# Patient Record
Sex: Male | Born: 1975
Health system: Southern US, Community
[De-identification: ages and names within clinical notes are randomized; demographics above are authoritative.]

## PROBLEM LIST (undated history)

## (undated) DIAGNOSIS — J45909 Unspecified asthma, uncomplicated: Secondary | ICD-10-CM

## (undated) DIAGNOSIS — J301 Allergic rhinitis due to pollen: Secondary | ICD-10-CM

## (undated) DIAGNOSIS — T7840XA Allergy, unspecified, initial encounter: Secondary | ICD-10-CM

## (undated) HISTORY — DX: Allergic rhinitis due to pollen: J30.1

## (undated) HISTORY — PX: CYST EXCISION: SHX5701

## (undated) HISTORY — DX: Allergy, unspecified, initial encounter: T78.40XA

## (undated) HISTORY — PX: NO PAST SURGERIES: SHX2092

---

## 2007-06-12 ENCOUNTER — Emergency Department (HOSPITAL_COMMUNITY): Admission: EM | Admit: 2007-06-12 | Discharge: 2007-06-12 | Payer: Self-pay | Admitting: Emergency Medicine

## 2007-10-12 ENCOUNTER — Emergency Department (HOSPITAL_COMMUNITY): Admission: EM | Admit: 2007-10-12 | Discharge: 2007-10-12 | Payer: Self-pay | Admitting: Emergency Medicine

## 2008-10-29 ENCOUNTER — Emergency Department (HOSPITAL_COMMUNITY): Admission: EM | Admit: 2008-10-29 | Discharge: 2008-10-29 | Payer: Self-pay | Admitting: Emergency Medicine

## 2009-12-22 ENCOUNTER — Emergency Department (HOSPITAL_COMMUNITY): Admission: EM | Admit: 2009-12-22 | Discharge: 2009-12-22 | Payer: Self-pay | Admitting: Emergency Medicine

## 2010-09-03 LAB — BASIC METABOLIC PANEL
BUN: 12 mg/dL (ref 6–23)
CO2: 28 mEq/L (ref 19–32)
Chloride: 105 mEq/L (ref 96–112)
Creatinine, Ser: 1.21 mg/dL (ref 0.4–1.5)
Glucose, Bld: 86 mg/dL (ref 70–99)
Potassium: 4.1 mEq/L (ref 3.5–5.1)

## 2011-02-15 LAB — URINALYSIS, ROUTINE W REFLEX MICROSCOPIC
Bilirubin Urine: NEGATIVE
Glucose, UA: NEGATIVE
Hgb urine dipstick: NEGATIVE
Specific Gravity, Urine: 1.029
Urobilinogen, UA: 0.2
pH: 6

## 2011-02-15 LAB — GC/CHLAMYDIA PROBE AMP, GENITAL: GC Probe Amp, Genital: NEGATIVE

## 2012-09-04 ENCOUNTER — Ambulatory Visit: Payer: Self-pay | Admitting: Family Medicine

## 2013-05-17 ENCOUNTER — Emergency Department (HOSPITAL_COMMUNITY)
Admission: EM | Admit: 2013-05-17 | Discharge: 2013-05-17 | Disposition: A | Discharge status: Home / Self Care | Payer: BC Managed Care – PPO | Attending: Emergency Medicine | Admitting: Emergency Medicine

## 2013-05-17 ENCOUNTER — Emergency Department (HOSPITAL_COMMUNITY): Payer: BC Managed Care – PPO

## 2013-05-17 ENCOUNTER — Encounter (HOSPITAL_COMMUNITY): Payer: Self-pay | Admitting: Emergency Medicine

## 2013-05-17 DIAGNOSIS — Y9389 Activity, other specified: Secondary | ICD-10-CM | POA: Insufficient documentation

## 2013-05-17 DIAGNOSIS — S46909A Unspecified injury of unspecified muscle, fascia and tendon at shoulder and upper arm level, unspecified arm, initial encounter: Secondary | ICD-10-CM | POA: Insufficient documentation

## 2013-05-17 DIAGNOSIS — IMO0002 Reserved for concepts with insufficient information to code with codable children: Secondary | ICD-10-CM | POA: Insufficient documentation

## 2013-05-17 DIAGNOSIS — S59909A Unspecified injury of unspecified elbow, initial encounter: Secondary | ICD-10-CM | POA: Insufficient documentation

## 2013-05-17 DIAGNOSIS — M25512 Pain in left shoulder: Secondary | ICD-10-CM

## 2013-05-17 DIAGNOSIS — J45909 Unspecified asthma, uncomplicated: Secondary | ICD-10-CM | POA: Insufficient documentation

## 2013-05-17 DIAGNOSIS — F17200 Nicotine dependence, unspecified, uncomplicated: Secondary | ICD-10-CM | POA: Insufficient documentation

## 2013-05-17 DIAGNOSIS — S4980XA Other specified injuries of shoulder and upper arm, unspecified arm, initial encounter: Secondary | ICD-10-CM | POA: Insufficient documentation

## 2013-05-17 DIAGNOSIS — M542 Cervicalgia: Secondary | ICD-10-CM

## 2013-05-17 DIAGNOSIS — M25522 Pain in left elbow: Secondary | ICD-10-CM

## 2013-05-17 DIAGNOSIS — S0993XA Unspecified injury of face, initial encounter: Secondary | ICD-10-CM | POA: Insufficient documentation

## 2013-05-17 DIAGNOSIS — Y92410 Unspecified street and highway as the place of occurrence of the external cause: Secondary | ICD-10-CM | POA: Insufficient documentation

## 2013-05-17 DIAGNOSIS — S6990XA Unspecified injury of unspecified wrist, hand and finger(s), initial encounter: Secondary | ICD-10-CM | POA: Insufficient documentation

## 2013-05-17 HISTORY — DX: Unspecified asthma, uncomplicated: J45.909

## 2013-05-17 MED ORDER — KETOROLAC TROMETHAMINE 15 MG/ML IJ SOLN
60.0000 mg | Freq: Once | INTRAMUSCULAR | Status: DC
  Filled 2013-05-17: qty 4

## 2013-05-17 MED ORDER — NAPROXEN 500 MG PO TABS: 500.0000 mg | ORAL_TABLET | Freq: Two times a day (BID) | ORAL | Status: DC

## 2013-05-17 MED ORDER — KETOROLAC TROMETHAMINE 60 MG/2ML IM SOLN
60.0000 mg | Freq: Once | INTRAMUSCULAR | Status: AC
  Administered 2013-05-17: 60 mg via INTRAMUSCULAR
  Filled 2013-05-17: qty 2

## 2013-05-17 MED ORDER — METHOCARBAMOL 500 MG PO TABS: 500.0000 mg | ORAL_TABLET | Freq: Two times a day (BID) | ORAL | Status: DC
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
## 2013-05-17 NOTE — Progress Notes (Signed)
[facility] provided pt with a list of primary care resources, [facility] card application, and [facility] information.

## 2013-05-21 NOTE — ED Provider Notes (Addendum)
Medical screening examination/treatment/procedure(s) were performed by non-physician practitioner and as supervising physician I was immediately available for consultation/collaboration.    Laray Anger, DO 05/21/13 1513  Laray Anger, DO 05/21/13 [phone]

## 2013-06-16 ENCOUNTER — Telehealth: Payer: Self-pay

## 2013-06-16 NOTE — Telephone Encounter (Signed)
Left message for call back Non identifiable  No pertinent information

## 2013-06-18 ENCOUNTER — Ambulatory Visit: Payer: BC Managed Care – PPO | Admitting: Internal Medicine

## 2013-06-21 NOTE — Telephone Encounter (Signed)
Patient rescheduled

## 2013-06-30 ENCOUNTER — Telehealth: Payer: Self-pay

## 2013-06-30 NOTE — Telephone Encounter (Signed)
LM with spouse for return call

## 2013-07-02 ENCOUNTER — Encounter: Payer: Self-pay | Admitting: Internal Medicine

## 2013-07-02 ENCOUNTER — Ambulatory Visit (INDEPENDENT_AMBULATORY_CARE_PROVIDER_SITE_OTHER): Payer: BC Managed Care – PPO | Admitting: Internal Medicine

## 2013-07-02 VITALS — BP 104/69 | HR 90 | Temp 98.2°F | Ht 69.5 in | Wt 225.0 lb

## 2013-07-02 DIAGNOSIS — J301 Allergic rhinitis due to pollen: Secondary | ICD-10-CM

## 2013-07-02 DIAGNOSIS — E010 Iodine-deficiency related diffuse (endemic) goiter: Secondary | ICD-10-CM

## 2013-07-02 DIAGNOSIS — Z0000 Encounter for general adult medical examination without abnormal findings: Secondary | ICD-10-CM

## 2013-07-02 DIAGNOSIS — J45909 Unspecified asthma, uncomplicated: Secondary | ICD-10-CM

## 2013-07-02 LAB — CBC WITH DIFFERENTIAL/PLATELET
BASOS ABS: 0 10*3/uL (ref 0.0–0.1)
BASOS PCT: 0.5 % (ref 0.0–3.0)
Eosinophils Absolute: 0.2 10*3/uL (ref 0.0–0.7)
Eosinophils Relative: 2.2 % (ref 0.0–5.0)
HEMATOCRIT: 46.7 % (ref 39.0–52.0)
HEMOGLOBIN: 15.5 g/dL (ref 13.0–17.0)
LYMPHS ABS: 1.8 10*3/uL (ref 0.7–4.0)
Lymphocytes Relative: 22.5 % (ref 12.0–46.0)
MCHC: 33.1 g/dL (ref 30.0–36.0)
MCV: 93.1 fl (ref 78.0–100.0)
MONOS PCT: 8 % (ref 3.0–12.0)
Monocytes Absolute: 0.6 10*3/uL (ref 0.1–1.0)
NEUTROS ABS: 5.3 10*3/uL (ref 1.4–7.7)
Neutrophils Relative %: 66.8 % (ref 43.0–77.0)
Platelets: 213 10*3/uL (ref 150.0–400.0)
RBC: 5.02 Mil/uL (ref 4.22–5.81)
RDW: 13.3 % (ref 11.5–14.6)
WBC: 7.9 10*3/uL (ref 4.5–10.5)

## 2013-07-02 NOTE — Progress Notes (Signed)
Pre visit review using our clinic review tool, if applicable. No additional management support is needed unless otherwise documented below in the visit note. 

## 2013-07-02 NOTE — Patient Instructions (Signed)
Get your blood work before you leave   Next visit is for a physical exam in 1 year, fasting Please make an appointment    Get OTC carpal tunnel syndrome splinters for your wrists, use them at night, if the numbness is not better let me now     Testicular Self-Exam A self-examination of your testicles involves looking at and feeling your testicles for abnormal lumps or swelling. Several things can cause swelling, lumps, or pain in your testicles. Some of these causes are:  Injuries.  Inflammation.  Infection.  Accumulation of fluids around your testicle (hydrocele).  Twisted testicles (testicular torsion).  Testicular cancer. Self-examination of the testicles and groin areas may be advised if you are at risk for testicular cancer. Risks for testicular cancer include:  An undescended testicle (cryptorchidism).  A history of previous testicular cancer.  A family history of testicular cancer. The testicles are easiest to examine after warm baths or showers and are more difficult to examine when you are cold. This is because the muscles attached to the testicles retract and pull them up higher or into the abdomen. Follow these steps while you are standing:  Hold your penis away from your body.  Roll one testicle between your thumb and forefinger, feeling the entire testicle.  Roll the other testicle between your thumb and forefinger, feeling the entire testicle. Feel for lumps, swelling, or discomfort. A normal testicle is egg shaped and feels firm. It is smooth and not tender. The spermatic cord can be felt as a firm spaghetti-like cord at the back of your testicle. It is also important to examine the crease between the front of your leg and your abdomen. Feel for any bumps that are tender. These could be enlarged lymph nodes.  Document Released: 08/19/2000 Document Revised: 01/13/2013 Document Reviewed: 11/02/2012 [facility] Patient Information 2014 [place], [place].

## 2013-07-02 NOTE — Telephone Encounter (Signed)
Left message for call back Non identifiable  

## 2013-07-02 NOTE — Assessment & Plan Note (Addendum)
Last TD? "within 10 years" No flu shot, declined Never had cscope  Labs Diet-exercise- see HPI, doing great, loosing weight. STE discussed  Other issues:  Hand paresthesias, CTS? Recommend OTC CTS splinters. Call if not better Scalp cyst-- lesion does look like cyst, told pt  only way to be 100% sure about it is exactly is to do a excision, to call for a  surgical referral if so desired Thyromegaly? Check a thyroid ultrasound and TFTs

## 2013-07-02 NOTE — Progress Notes (Signed)
   Subjective:    Patient ID: Jerome Barker, male    DOB: 1975/07/27, 38 y.o.   MRN: [id number]  DOS:  07/02/2013 New pt , CPX Also complains of fingers numbness R>L, usually thumb and #2-3 finger, symptoms are on and off. Denies neck pain, wrist pain. Sometimes has also tingling in the toes. 2 times in the last 5 years had dizziness after a heavy meal. Went to another doctor and diagnosed with vertigo. Also has a cyst of the scalp, not getting larger for the last 6 or 7 years.    Past Medical History  Diagnosis Date  . Asthma   . Hay fever     Past Surgical History  Procedure Laterality Date  . No past surgeries      History   Social History  . Marital Status: Legally Separated    Spouse Name: N/A    Number of Children: 2  . 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 Smoking status: Former Smoker -- 0.25 packs/day    Types: Cigarettes  . Smokeless tobacco: Never Used     Comment: quit ~ 2014   . Alcohol Use: 3.6 oz/week    6 Cans of beer per week     Comment: Every other day  . Drug Use: No     Comment: no marihuana in > 1 year  . Sexual Activity: Not on file   Other Topics Concern  . Not on file   Social History Narrative   12 grade education   Lives w/ wife     ROS Diet-- has improved lately, lost 30 pounds in last 7 months  Exercise-- nothing regular  No  SSCP, SOB, lower extremity edema Denies  nausea, vomiting diarrhea Denies  blood in the stools (-) cough, sputum production (-) wheezing, chest congestion No dysuria, gross hematuria, difficulty urinating   No anxiety, depression       Objective:   Physical Exam  HENT:  Head:     BP 104/69  Pulse 90  Temp(Src) 98.2 F (36.8 C)  Ht 5' 9.5" (1.765 m)  Wt 225 lb (102.059 kg)  BMI 32.76 kg/m2  SpO2 100% General -- alert, well-developed, NAD.  Neck --mild  Thyromegaly ? No tender or nodular; no LADs HEENT-- Not pale.   Lungs --  normal respiratory effort, no intercostal retractions, no accessory muscle use, and normal breath sounds.  Heart-- normal rate, regular rhythm, no murmur.  Abdomen-- Not distended, good bowel sounds,soft, non-tender. Extremities-- no pretibial edema bilaterally  Neurologic--  alert & oriented X3. Speech normal, gait normal, strength normal in all extremities.  DTRs symmetric (slt decreased throughout)  EOMI, PERLA   Psych-- Cognition and judgment appear intact. Cooperative with normal attention span and concentration. No anxious or depressed appearing.       Assessment & Plan:

## 2013-07-02 NOTE — Assessment & Plan Note (Signed)
Not using albuterol unless he has a cold

## 2013-07-04 ENCOUNTER — Encounter: Payer: Self-pay | Admitting: Internal Medicine

## 2013-07-05 LAB — LIPID PANEL
CHOL/HDL RATIO: 3
Cholesterol: 142 mg/dL (ref 0–200)
HDL: 56.8 mg/dL (ref >39.00)
LDL CALC: 79 mg/dL (ref 0–99)
Triglycerides: 29 mg/dL (ref 0.0–149.0)
VLDL: 5.8 mg/dL (ref 0.0–40.0)

## 2013-07-05 LAB — COMPREHENSIVE METABOLIC PANEL
ALT: 16 U/L (ref 0–53)
AST: 22 U/L (ref 0–37)
Albumin: 4.1 g/dL (ref 3.5–5.2)
Alkaline Phosphatase: 54 U/L (ref 39–117)
BILIRUBIN TOTAL: 0.5 mg/dL (ref 0.3–1.2)
BUN: 18 mg/dL (ref 6–23)
CO2: 28 meq/L (ref 19–32)
CREATININE: 1 mg/dL (ref 0.4–1.5)
Calcium: 9.3 mg/dL (ref 8.4–10.5)
Chloride: 108 mEq/L (ref 96–112)
GFR: 108.77 mL/min (ref >60.00)
Glucose, Bld: 107 mg/dL — ABNORMAL HIGH (ref 70–99)
Potassium: 4.2 mEq/L (ref 3.5–5.1)
Sodium: 145 mEq/L (ref 135–145)
Total Protein: 8.1 g/dL (ref 6.0–8.3)

## 2013-07-05 LAB — T4, FREE: Free T4: 0.85 ng/dL (ref 0.60–1.60)

## 2013-07-05 LAB — TSH: TSH: 1.18 u[IU]/mL (ref 0.35–5.50)

## 2013-07-05 LAB — T3, FREE: T3 FREE: 3.1 pg/mL (ref 2.3–4.2)

## 2013-07-06 ENCOUNTER — Telehealth: Payer: Self-pay | Admitting: *Deleted

## 2013-07-06 ENCOUNTER — Encounter: Payer: Self-pay | Admitting: *Deleted

## 2013-07-06 NOTE — Telephone Encounter (Signed)
Patient is calling to check the status of his lab results.

## 2013-07-06 NOTE — Telephone Encounter (Signed)
Please advise 

## 2013-07-07 ENCOUNTER — Ambulatory Visit
Admission: RE | Admit: 2013-07-07 | Discharge: 2013-07-07 | Disposition: A | Discharge status: Home / Self Care | Payer: BC Managed Care – PPO | Source: Ambulatory Visit | Attending: Internal Medicine | Admitting: Internal Medicine

## 2013-07-07 DIAGNOSIS — E010 Iodine-deficiency related diffuse (endemic) goiter: Secondary | ICD-10-CM

## 2013-07-09 ENCOUNTER — Other Ambulatory Visit: Payer: Self-pay | Admitting: Internal Medicine

## 2013-07-09 DIAGNOSIS — E041 Nontoxic single thyroid nodule: Secondary | ICD-10-CM

## 2013-07-20 ENCOUNTER — Other Ambulatory Visit (HOSPITAL_COMMUNITY)
Admission: RE | Admit: 2013-07-20 | Discharge: 2013-07-20 | Disposition: A | Discharge status: Home / Self Care | Payer: BC Managed Care – PPO | Source: Ambulatory Visit | Attending: Interventional Radiology | Admitting: Interventional Radiology

## 2013-07-20 ENCOUNTER — Ambulatory Visit
Admission: RE | Admit: 2013-07-20 | Discharge: 2013-07-20 | Disposition: A | Discharge status: Home / Self Care | Payer: BC Managed Care – PPO | Source: Ambulatory Visit | Attending: Internal Medicine | Admitting: Internal Medicine

## 2013-07-20 DIAGNOSIS — E041 Nontoxic single thyroid nodule: Secondary | ICD-10-CM

## 2013-07-20 DIAGNOSIS — E049 Nontoxic goiter, unspecified: Secondary | ICD-10-CM | POA: Insufficient documentation

## 2013-11-30 ENCOUNTER — Ambulatory Visit (INDEPENDENT_AMBULATORY_CARE_PROVIDER_SITE_OTHER): Payer: BC Managed Care – PPO | Admitting: Medical

## 2013-11-30 ENCOUNTER — Encounter: Payer: Self-pay | Admitting: Medical

## 2013-11-30 VITALS — BP 116/77 | HR 80 | Temp 98.2°F | Wt 234.2 lb

## 2013-11-30 DIAGNOSIS — M25519 Pain in unspecified shoulder: Secondary | ICD-10-CM | POA: Insufficient documentation

## 2013-11-30 DIAGNOSIS — M25511 Pain in right shoulder: Secondary | ICD-10-CM

## 2013-11-30 MED ORDER — TRAMADOL HCL 50 MG PO TABS: 50.0000 mg | ORAL_TABLET | Freq: Three times a day (TID) | ORAL | Status: DC | PRN

## 2013-11-30 MED ORDER — MELOXICAM 7.5 MG PO TABS: 7.5000 mg | ORAL_TABLET | Freq: Every day | ORAL | Status: DC

## 2013-11-30 MED ORDER — KETOROLAC TROMETHAMINE 60 MG/2ML IM SOLN
60.0000 mg | Freq: Once | INTRAMUSCULAR | Status: AC
  Administered 2013-11-30: 60 mg via INTRAMUSCULAR

## 2013-11-30 MED ORDER — TIZANIDINE HCL 4 MG PO CAPS: 4.0000 mg | ORAL_CAPSULE | Freq: Three times a day (TID) | ORAL | Status: DC

## 2013-11-30 NOTE — Patient Instructions (Signed)
If pain not resolved by one week then follow up in 2 wks or prn. Take medication rx'd today. Stop goody powder. Get xray. If pain persists consider PT or ortho evaluation(evaluate rotator cuff or/and  biceps tendon.)

## 2013-11-30 NOTE — Assessment & Plan Note (Signed)
Rest if possible/ no heavy lifting. ROM exercises as tolerated. If painful then stop and notify us. Xray ordered. Pt advised get done within 1-2 weeks. Rx meloxicam, zanaflex, and tramadol. Toradol im in office. DC goody powder. Pt states prior meds did not work in past so dc'd naprsoyn and robaxin. If pain  not resolved by 1 wk then will need to see in 2 wks or as needed. Would consider PT as well if pain persists. Or if severe pain occurs orthopedist to evaluate rotator cuff.

## 2013-11-30 NOTE — Progress Notes (Signed)
Subjective:    Patient ID: Jerome Barker, male    DOB: 09-May-1976, 38 y.o.   MRN: [id number]  HPI  Pt in with some rt shoulder pain. Pt states 10 years ago he fell back then and tried to stop his fall. He describes displacement at that time and maybe small fracture. Pt states given sling and he really did not rest. Pt states in the past intermittent occasional pain. Some periods complete relief. But 2 wks recurrent pain on moderate to severe. No injury 2 wks ago but thinks slept wrong. Pt uses Goody powders a lot over passed 2 wks. He tried advil. He is out of prior rx robaxin and naprosyn. 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   Past Medical History  Diagnosis Date  . Asthma   . Hay fever     History   Social History  . Marital Status: Legally Separated    Spouse Name: N/A    Number of Children: 2  . 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 Smoking status: Former Smoker -- 0.25 packs/day    Types: Cigarettes  . Smokeless tobacco: Never Used     Comment: quit ~ 2014   . Alcohol Use: 3.6 oz/week    6 Cans of beer per week     Comment: Every other day  . Drug Use: No     Comment: no marihuana in > 1 year  . Sexual Activity: Not on file   Other Topics Concern  . Not on file   Social History Narrative   12 grade education   Lives w/ wife     Past Surgical History  Procedure Laterality Date  . No past surgeries      Family History  Problem Relation Age of Onset  . Colon cancer Neg Hx   . Prostate cancer Neg Hx   . Diabetes Neg Hx   . CAD Neg Hx     Allergies  Allergen Reactions  . Prednisone Other (See Comments)    Flu-like symptoms (any other steroids)    Current Outpatient Prescriptions on File Prior to Visit  Medication Sig Dispense Refill  . nystatin-triamcinolone (MYCOLOG II) cream       . PROAIR HFA 108 (90 BASE) MCG/ACT inhaler        No current facility-administered medications on  file prior to visit.    BP 116/77  Pulse 80  Temp(Src) 98.2 F (36.8 C) (Oral)  Wt 234 lb 3.2 oz (106.232 kg)  SpO2 98%     Review of Systems  Constitutional: Negative for fatigue.  Respiratory: Negative for apnea and chest tightness.   Cardiovascular: Negative for chest pain.  Musculoskeletal: Positive for arthralgias.       Rt shoulder- pain diffuse. More in anterior aspect region and trapezius region(toward shoulder).  Skin: Negative.          Objective:   Physical Exam General Mental Status- Alert. General Appearance- Not in acute distress.  Skin General:-Color-Normal Color. Moisture- Normal Moisture  Chest and Lung Exam Auscultation:Rhythm- Regular. Murmurs & Other Heart Sounds: Auscultation of the heart reveals- No murmurs.  Lungs Clear even and unlabored.  Rt shoulder Good rom. Moderate - severe direct  tenderness over biceps tendon region on palpation. Abduction and adduction mild pain. No crepitus. Pt demonstrate if hand in supination and he abducts rt upper ext shoulder pain increases.  Assessment & Plan:  1. Rx toradol 60 mg im in office today. Stop Goody powder. Stressed importance regarding reasoning. He expressed understanding.  2. Xray rt shoulder stretching exercises.  3. zanaflex 4 mg po tid prn muscle spasms. meloxicam 7.5 po q day. Rx tramadol 50 mg po q 6 hrs prn pain  4.  Follow up in 2 wks(if any pain persisting) or prn sooner worsening signs or symptoms.

## 2014-08-09 ENCOUNTER — Telehealth: Payer: Self-pay | Admitting: Internal Medicine

## 2014-08-09 NOTE — Telephone Encounter (Signed)
Pt will need to be seen, has not been seen by Dr. Larose Kells since 06/2013.

## 2014-08-09 NOTE — Telephone Encounter (Signed)
Appointment scheduled.

## 2014-08-09 NOTE — Telephone Encounter (Signed)
Caller name: yadriel Relation to pt: self Call back number: [phone] Pharmacy: [facility] on [address]  Reason for call:   Requesting refill of nystatin-triamcinolone (MYCOLOG II) cream

## 2014-08-10 ENCOUNTER — Ambulatory Visit: Payer: Self-pay | Admitting: Internal Medicine

## 2014-08-11 ENCOUNTER — Ambulatory Visit (INDEPENDENT_AMBULATORY_CARE_PROVIDER_SITE_OTHER): Payer: BLUE CROSS/BLUE SHIELD | Admitting: Medical

## 2014-08-11 ENCOUNTER — Encounter: Payer: Self-pay | Admitting: Medical

## 2014-08-11 VITALS — BP 114/77 | HR 88 | Temp 98.1°F | Ht 69.0 in | Wt 222.0 lb

## 2014-08-11 DIAGNOSIS — J301 Allergic rhinitis due to pollen: Secondary | ICD-10-CM

## 2014-08-11 DIAGNOSIS — J0100 Acute maxillary sinusitis, unspecified: Secondary | ICD-10-CM

## 2014-08-11 DIAGNOSIS — L309 Dermatitis, unspecified: Secondary | ICD-10-CM | POA: Insufficient documentation

## 2014-08-11 DIAGNOSIS — B353 Tinea pedis: Secondary | ICD-10-CM

## 2014-08-11 DIAGNOSIS — J309 Allergic rhinitis, unspecified: Secondary | ICD-10-CM | POA: Insufficient documentation

## 2014-08-11 MED ORDER — NYSTATIN-TRIAMCINOLONE 100000-0.1 UNIT/GM-% EX OINT: 1.0000 "application " | TOPICAL_OINTMENT | Freq: Two times a day (BID) | CUTANEOUS | Status: DC

## 2014-08-11 MED ORDER — LORATADINE 10 MG PO TABS: 10.0000 mg | ORAL_TABLET | Freq: Every day | ORAL | Status: DC

## 2014-08-11 MED ORDER — CEFDINIR 300 MG PO CAPS: 300.0000 mg | ORAL_CAPSULE | Freq: Two times a day (BID) | ORAL | Status: DC

## 2014-08-11 NOTE — Assessment & Plan Note (Addendum)
Rx claritin.

## 2014-08-11 NOTE — Assessment & Plan Note (Signed)
Treatment for his feet does have hydrocortisone and helps with hands on past so can continue to apply if needed. But stressed to reduce hand washing daily. Now washing hands about 20 times a day.

## 2014-08-11 NOTE — Progress Notes (Signed)
Pre visit review using our clinic review tool, if applicable. No additional management support is needed unless otherwise documented below in the visit note. 

## 2014-08-11 NOTE — Assessment & Plan Note (Signed)
With lt om. Infection following allergies and likely causing mild dizziness.  Rx cefdinir antibiotic.

## 2014-08-11 NOTE — Assessment & Plan Note (Signed)
Conservative measures and refill hi nystatin cream. If he gets frustrated with recoccurence intermittent then notify me and refer to dermatologist.

## 2014-08-11 NOTE — Patient Instructions (Signed)
Allergic rhinitis Rx claritin.   Sinusitis, acute maxillary With lt om. Infection following allergies and likely causing mild dizziness.  Rx cefdinir antibiotic.   Tinea pedis Conservative measures and refill hi nystatin cream. If he gets frustrated with recoccurence intermittent then notify me and refer to dermatologist.   Eczema of both hands Treatment for his feet does have hydrocortisone and helps with hands on past so can continue to apply if needed. But stressed to reduce hand washing daily. Now washing hands about 20 times a day.    Follow up 7-10 days or as needed

## 2014-08-11 NOTE — Progress Notes (Signed)
Subjective:    Patient ID: Jerome Barker, male    DOB: [birth year], 39 y.o.   MRN: [id number]  HPI   Pt feet are itching. This has been going on in his 20's. Pt takes nystatin in the past but it still persist/will reoccur. Pt has tried otc treatments as well.   Dizziness very mild usually in spring and goes away in summer. Occurs with nasal congestion and runny nose. No dizziness now. But 4 days ago had some dizziness for 2 days. But has gone away. Past 3 years some dizziness/ vertigo with associated allergy symptoms.  Pt has some mild rash on his hands. Between base of his fingers. Mild itchy. This has been going on + off for a couple of years but mid reoccurrence past 2 days . Wash hands a lot at work. Maybe 20 times a day. Pt states med he uses for his feet resolves symptoms quickly.    Review of Systems  Constitutional: Negative for fever, chills and fatigue.  HENT: Positive for congestion, rhinorrhea and sinus pressure. Negative for ear discharge, hearing loss, postnasal drip, sneezing and sore throat.   Respiratory: Negative for cough, chest tightness and wheezing.   Cardiovascular: Negative for chest pain and palpitations.  Genitourinary: Negative for dysuria, frequency, flank pain, decreased urine volume, scrotal swelling, penile pain and testicular pain.  Musculoskeletal: Negative for back pain.  Skin: Positive for rash.       See hop  Neurological: Positive for dizziness.       See hop  Hematological: Negative for adenopathy. Does not bruise/bleed easily.  Psychiatric/Behavioral: Negative for behavioral problems and decreased concentration.   Past Medical History  Diagnosis Date  . Asthma   . Hay fever     History   Social History  . Marital Status: Legally Separated    Spouse Name: N/A  . Number of Children: 2  . 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 Smoking status: Former Smoker -- 0.25  packs/day    Types: Cigarettes  . Smokeless tobacco: Never Used     Comment: quit ~ 2014   . Alcohol Use: 3.6 oz/week    6 Cans of beer per week     Comment: Every other day  . Drug Use: No     Comment: no marihuana in > 1 year  . Sexual Activity: Not on file   Other Topics Concern  . Not on file   Social History Narrative   12 grade education   Lives w/ wife     Past Surgical History  Procedure Laterality Date  . No past surgeries      Family History  Problem Relation Age of Onset  . Colon cancer Neg Hx   . Prostate cancer Neg Hx   . Diabetes Neg Hx   . CAD Neg Hx     Allergies  Allergen Reactions  . Prednisone Other (See Comments)    Flu-like symptoms (any other steroids)    Current Outpatient Prescriptions on File Prior to Visit  Medication Sig Dispense Refill  . meloxicam (MOBIC) 7.5 MG tablet Take 1 tablet (7.5 mg total) by mouth daily. 30 tablet 0  . PROAIR HFA 108 (90 BASE) MCG/ACT inhaler     . tiZANidine (ZANAFLEX) 4 MG capsule Take 1 capsule (4 mg total) by mouth 3 (three) times daily. 21 capsule 0  . traMADol (ULTRAM) 50 MG tablet Take 1  tablet (50 mg total) by mouth every 8 (eight) hours as needed. 30 tablet 0   No current facility-administered medications on file prior to visit.    BP 114/77 mmHg  Pulse 88  Temp(Src) 98.1 F (36.7 C) (Oral)  Ht 5\' 9"  (1.753 m)  Wt 222 lb (100.699 kg)  BMI 32.77 kg/m2  SpO2 97%      Objective:   Physical Exam  General  Mental Status - Alert. General Appearance - Well groomed. Not in acute distress.  Skin Rashes- No Rashes.  HEENT Head- Normal. Ear Auditory Canal - Left- Normal. Right - Normal.Tympanic Membrane- Left- Normal. Right- Normal. Eye Sclera/Conjunctiva- Left- Normal. Right- Normal. Nose & Sinuses Nasal Mucosa- Left-  Boggy + Congested. Right-  Boggy + Congested. Maxillary sinus pressure to palpation. Mouth & Throat Lips: Upper Lip- Normal: no dryness, cracking, pallor, cyanosis, or  vesicular eruption. Lower Lip-Normal: no dryness, cracking, pallor, cyanosis or vesicular eruption. Buccal Mucosa- Bilateral- No Aphthous ulcers. Oropharynx- No Discharge or Erythema. +pnd. Tonsils: Characteristics- Bilateral- No Erythema or Congestion. Size/Enlargement- Bilateral- No enlargement. Discharge- bilateral-None.  Neck Neck- Supple. No Masses.   Chest and Lung Exam Auscultation: Breath Sounds:- even and unlabored.  Cardiovascular Auscultation:Rythm- Regular, rate and rhythm. Murmurs & Other Heart Sounds:Ausculatation of the heart reveal- No Murmurs.  Lymphatic Head & Neck General Head & Neck Lymphatics: Bilateral: Description- No Localized lymphadenopathy.   Neurologic Cranial Nerve exam:- CN III-XII intact(No nystagmus), symmetric smile. Drift Test:- No drift. Romberg Exam:- Negative.  Heal to Toe Gait exam:-Normal. Finger to Nose:- Normal/Intact Strength:- 5/5 equal and symmetric strength both upper and lower extremities.  Skin-feet- flaky appearance to both feet. Lt side>than rt. Mild faint inflamed appearance. Some cracking of skin between toes.  Skin-hands- some scattered hypopigmented areas with faint papular appearance between digits. Other area of skin appear hyperpigmented.         Assessment & Plan:

## 2014-10-28 IMAGING — US US THYROID BIOPSY
1 series · 11 of 11 positions shown · non-contrast
Comparison: 07/07/2013

CLINICAL DATA: Dominant 3 cm right thyroid nodule

EXAM:
ULTRASOUND GUIDED NEEDLE ASPIRATE BIOPSY OF THE THYROID GLAND

[Series 1: us thyroid biopsy · 0.08mm/px · 11 acquisitions, 11 frames shown]
[im 1/11]
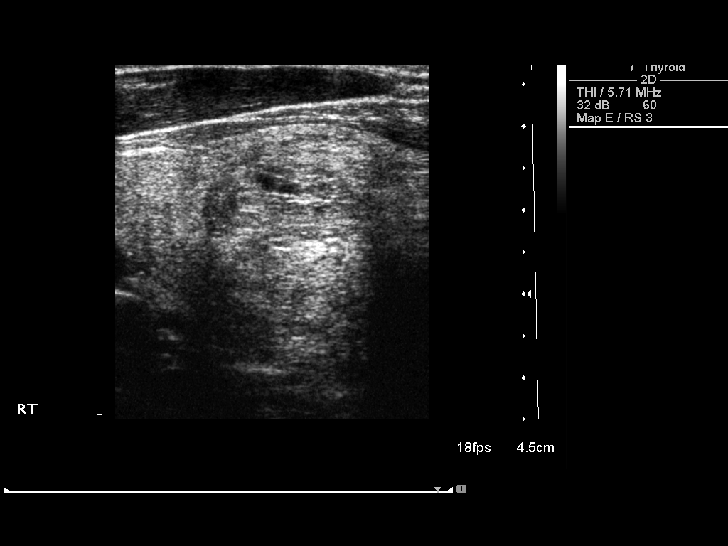
[im 2/11]
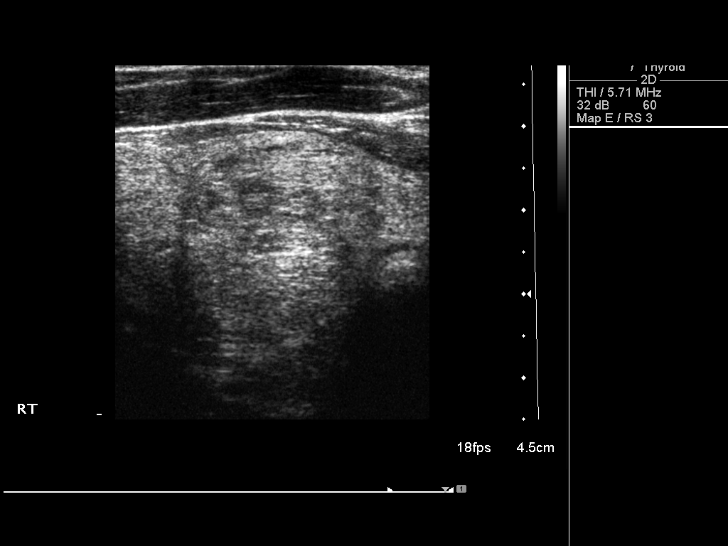
[im 3/11]
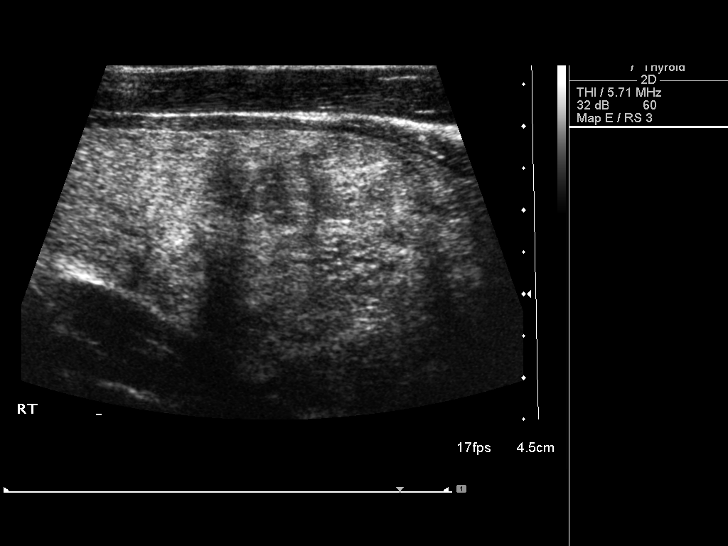
[im 4/11]
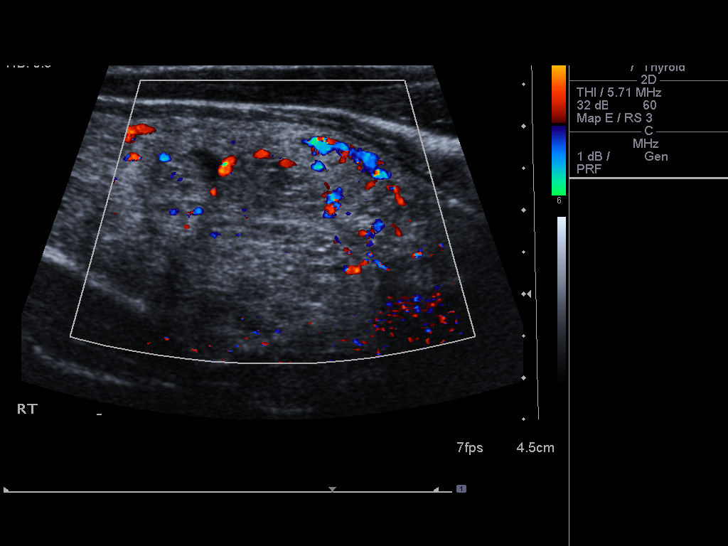
[im 5/11]
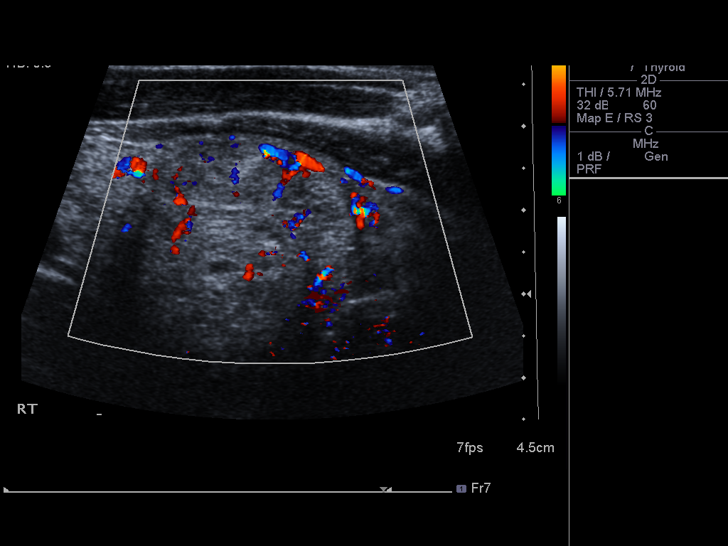
[im 6/11]
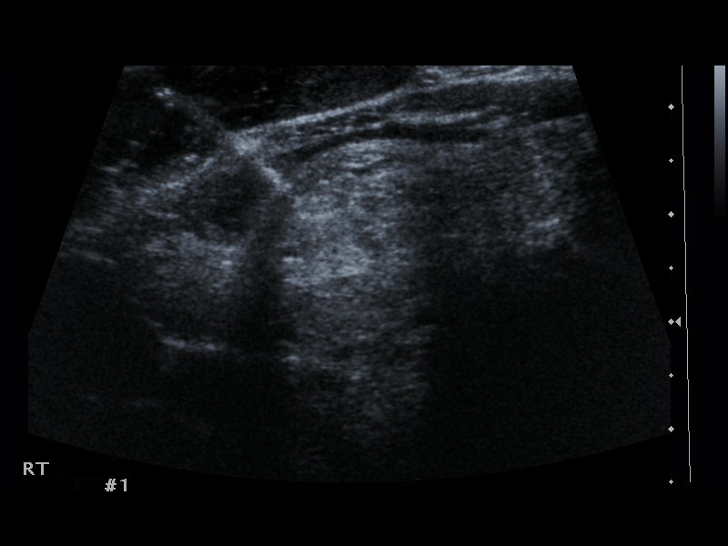
[im 7/11]
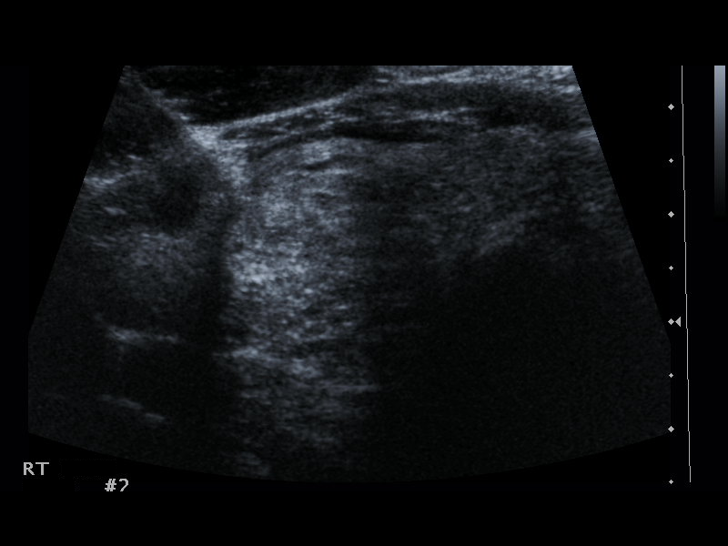
[im 8/11]
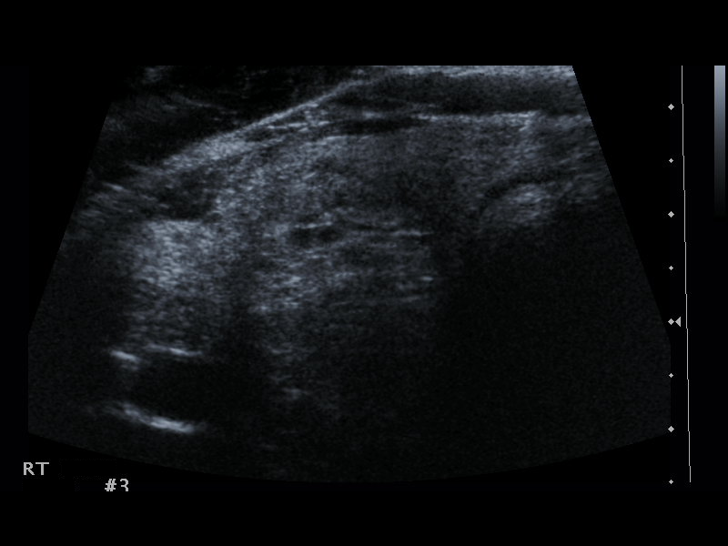
[im 9/11]
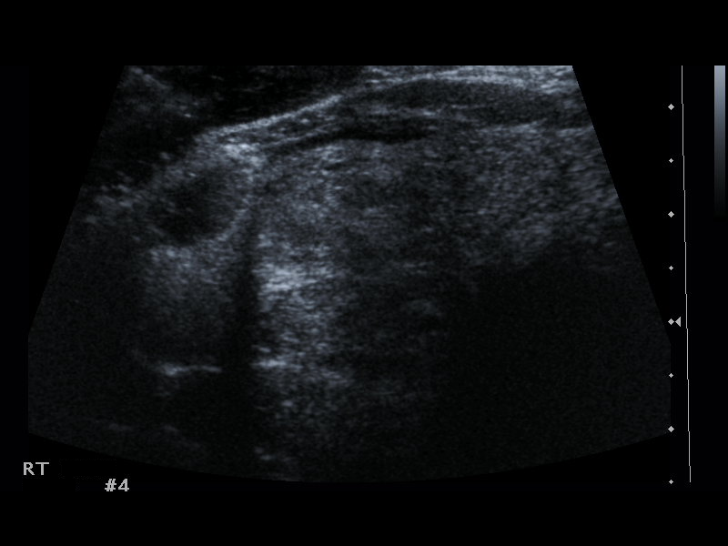
[im 10/11]
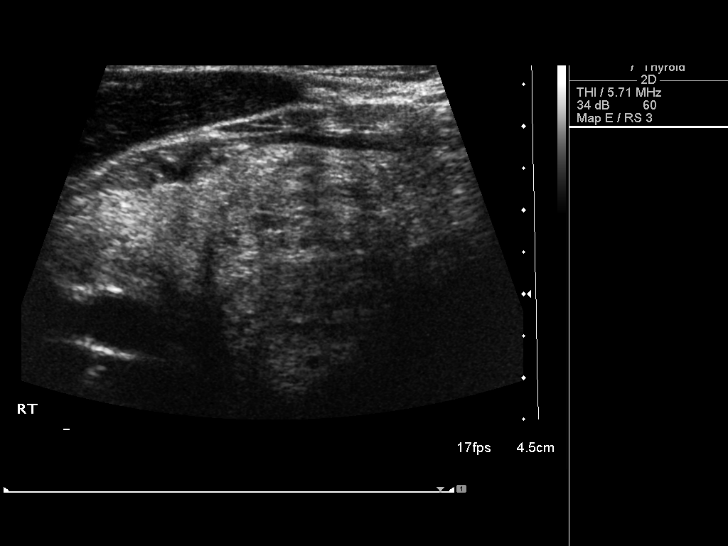
[im 11/11]
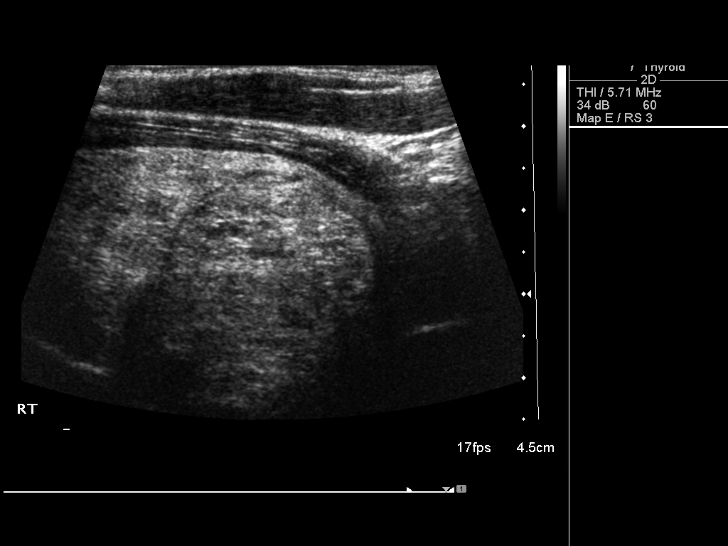

[11 of 11 positions shown; findings below may reference images not displayed]

FINDINGS: Imaging confirms needle placement in the dominant right thyroid
nodule
IMPRESSION: Ultrasound guided needle aspirate biopsy performed of the dominant
right thyroid nodule.

PROCEDURE:
Thyroid biopsy was thoroughly discussed with the patient and
questions were answered. The benefits, risks, alternatives, and
complications were also discussed. The patient understands and
wishes to proceed with the procedure. Written consent was obtained.

Ultrasound was performed to localize and mark an adequate site for
the biopsy. The patient was then prepped and draped in a normal
sterile fashion. Local anesthesia was provided with 1% lidocaine.
Using direct ultrasound guidance, 4 passes were made using needles
into the nodule within the Right lobe of the thyroid. Ultrasound was
used to confirm needle placements on all occasions. Specimens were
sent to Pathology for analysis.

Complications:  No immediate

## 2015-07-04 ENCOUNTER — Encounter: Payer: Self-pay | Admitting: Internal Medicine

## 2015-07-04 ENCOUNTER — Ambulatory Visit (INDEPENDENT_AMBULATORY_CARE_PROVIDER_SITE_OTHER): Payer: BLUE CROSS/BLUE SHIELD | Admitting: Internal Medicine

## 2015-07-04 VITALS — BP 108/78 | HR 76 | Temp 97.7°F | Ht 69.0 in | Wt 240.2 lb

## 2015-07-04 DIAGNOSIS — R1013 Epigastric pain: Secondary | ICD-10-CM | POA: Diagnosis not present

## 2015-07-04 DIAGNOSIS — R079 Chest pain, unspecified: Secondary | ICD-10-CM | POA: Diagnosis not present

## 2015-07-04 MED ORDER — ALBUTEROL SULFATE HFA 108 (90 BASE) MCG/ACT IN AERS: 1.0000 | INHALATION_SPRAY | Freq: Four times a day (QID) | RESPIRATORY_TRACT | Status: DC | PRN

## 2015-07-04 NOTE — Progress Notes (Signed)
Pre visit review using our clinic review tool, if applicable. No additional management support is needed unless otherwise documented below in the visit note. 

## 2015-07-04 NOTE — Patient Instructions (Signed)
GO TO THE LAB : Get the blood work    GO TO THE FRONT DESK  Schedule a complete physical exam to be done in 4-5 weeks Please be fasting   Start taking Nexium 1 tablet before breakfast every day for 6 weeks  If you have severe chest pain, difficulty breathing. Call or go to the ER

## 2015-07-04 NOTE — Progress Notes (Signed)
Subjective:    Patient ID: Jerome Barker, male    DOB: 27-May-1976, 40 y.o.   MRN: [id number]  DOS:  07/04/2015 Type of visit - description : Acute visit Interval history:  Symptoms started 07/02/2015 with nausea, vomited once, a burning/cramp feeling and the epigastric and anterior chest area. Symptoms are severe, on-off , had to leave work, he was slightly diaphoretic. Symptoms gradually subsided, the next day he had discomfort described as burning throughout the day. This morning the symptoms were more noticeable so he decided to be checked. At the time of OV he is asx   Prior to the onset of symptoms he had a lot of hot wings and salsa although that is something he does do frequently w/o problems.   Review of Systems Appetite is normal Denies any substernal pressure like pain. No difficulty breathing or lower extremity edema Mild diarrhea today No hematemesis No dysphagia or odynophagia He reports has been having some heartburn lately but mild.  Past Medical History  Diagnosis Date  . Asthma   . Hay fever     Past Surgical History  Procedure Laterality Date  . No past surgeries      Social History   Social History  . Marital Status: Legally Separated    Spouse Name: N/A  . Number of Children: 2  . 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 Smoking status: Former Smoker -- 0.25 packs/day    Types: Cigarettes  . Smokeless tobacco: Never Used     Comment: quit ~ 2014   . Alcohol Use: 3.6 oz/week    6 Cans of beer per week     Comment: Every other day  . Drug Use: No     Comment: no marihuana in > 1 year  . Sexual Activity: Not on file   Other Topics Concern  . Not on file   Social History Narrative   12 grade education   Lives w/ wife         Medication List       This list is accurate as of: 07/04/15  8:09 PM.  Always use your most recent med list.               albuterol 108 (90  Base) MCG/ACT inhaler  Commonly known as:  PROAIR HFA  Inhale 1-2 puffs into the lungs every 6 (six) hours as needed for wheezing or shortness of breath.     esomeprazole 20 MG capsule  Commonly known as:  NEXIUM  Take 20 mg by mouth daily at 12 noon.     nystatin-triamcinolone ointment  Commonly known as:  MYCOLOG  Apply 1 application topically 2 (two) times daily.           Objective:   Physical Exam BP 108/78 mmHg  Pulse 76  Temp(Src) 97.7 F (36.5 C) (Oral)  Ht 5\' 9"  (1.753 m)  Wt 240 lb 4 oz (108.977 kg)  BMI 35.46 kg/m2  SpO2 98% General:   Well developed, well nourished . NAD.  HEENT:  Normocephalic . Face symmetric, atraumatic Lungs:  CTA B Normal respiratory effort, no intercostal retractions, no accessory muscle use. Heart: RRR,  no murmur.  no pretibial edema bilaterally  Abdomen:  Not distended, soft, non-tender. No rebound or rigidity.   Skin: Not pale. Not jaundice Neurologic:  alert & oriented X3.  Speech normal, gait appropriate for age and unassisted  Psych--  Cognition and judgment appear intact.  Cooperative with normal attention span and concentration.  Behavior appropriate. No anxious or depressed appearing.    Assessment & Plan:   Assessment Asthma Allergies Goiter-- per [place] 06-2013 ,dominant R nodule. BX (-) 06-2013  Plan Chest and epigastric pain: Symptoms are atypical for CAD, no symptoms today, I decided to get an EKG, sinus rhythm, some ST abnormalities.  Will discuss with cardiology in the meantime will check a CBC, BMP, CK and troponinx1. ( EKG discuss with cardiology: Early repolarization) Start PPIs, samples for Nexium provided. See instructions

## 2015-07-06 ENCOUNTER — Telehealth: Payer: Self-pay | Admitting: Internal Medicine

## 2015-07-06 ENCOUNTER — Other Ambulatory Visit: Payer: Self-pay

## 2015-07-06 MED ORDER — NYSTATIN-TRIAMCINOLONE 100000-0.1 UNIT/GM-% EX OINT: 1.0000 "application " | TOPICAL_OINTMENT | Freq: Two times a day (BID) | CUTANEOUS | Status: DC

## 2015-07-06 NOTE — Telephone Encounter (Signed)
LVM advising patient of message of below

## 2015-07-06 NOTE — Telephone Encounter (Signed)
Rx sent. Please inform Pt that he did not have labs completed on 07/04/2015 when he was here. Still needs to be completed. Have him schedule lab appt. Thank you.

## 2015-07-06 NOTE — Telephone Encounter (Signed)
Relation to [id number] Call back [phone] Pharmacy: [facility] [place], [place]. [phone] (Phone) [phone] (Fax)         Reason for call:  Patient requesting a refill nystatin-triamcinolone ointment (MYCOLOG

## 2015-08-07 ENCOUNTER — Telehealth: Payer: Self-pay | Admitting: *Deleted

## 2015-08-07 ENCOUNTER — Encounter: Payer: Self-pay | Admitting: *Deleted

## 2015-08-07 NOTE — Telephone Encounter (Signed)
Pre-Visit Call completed with patient and chart updated.   Pre-Visit Info documented in Specialty Comments under [employer].    

## 2015-08-08 ENCOUNTER — Encounter: Payer: Self-pay | Admitting: Internal Medicine

## 2015-08-08 ENCOUNTER — Ambulatory Visit (INDEPENDENT_AMBULATORY_CARE_PROVIDER_SITE_OTHER): Payer: BLUE CROSS/BLUE SHIELD | Admitting: Internal Medicine

## 2015-08-08 VITALS — BP 102/66 | HR 76 | Temp 98.0°F | Ht 69.0 in | Wt 239.5 lb

## 2015-08-08 DIAGNOSIS — Z0000 Encounter for general adult medical examination without abnormal findings: Secondary | ICD-10-CM

## 2015-08-08 DIAGNOSIS — Z09 Encounter for follow-up examination after completed treatment for conditions other than malignant neoplasm: Secondary | ICD-10-CM

## 2015-08-08 DIAGNOSIS — E049 Nontoxic goiter, unspecified: Secondary | ICD-10-CM

## 2015-08-08 NOTE — Progress Notes (Signed)
Subjective:    Patient ID: Jerome Barker, male    DOB: 02-23-1976, 40 y.o.   MRN: [id number]  DOS:  08/08/2015 Type of visit - description : cPX Interval history: was recently seen with chest pain, symptoms better with PPIs.    Review of Systems Constitutional: No fever. No chills. No unexplained wt changes. No unusual sweats  HEENT: No dental problems, no ear discharge, no facial swelling, no voice changes. No eye discharge, no eye  redness , no  intolerance to light   Respiratory: No wheezing , no  difficulty breathing. No cough , no mucus production  Cardiovascular: No CP, no leg swelling , no  Palpitations  GI: no nausea, no vomiting, no diarrhea , no  abdominal pain.  recently seen with epigastric pain and chest pain, better with PPIs and better diet.    Endocrine: No polyphagia, no polyuria , no polydipsia  GU: No dysuria, gross hematuria, difficulty urinating. No urinary urgency, no frequency.  Musculoskeletal: No joint swellings or unusual aches or pains  Skin: No change in the color of the skin, palor , no  Rash  Allergic, immunologic: No environmental allergies , no  food allergies  Neurological: No dizziness no  syncope. No headaches. No diplopia, no slurred, no slurred speech, no motor deficits, no facial  Numbness  Hematological: No enlarged lymph nodes, no easy bruising , no unusual bleedings  Psychiatry: No suicidal ideas, no hallucinations, no beavior problems, no confusion.  No unusual/severe anxiety, no depression   Past Medical History  Diagnosis Date  . Asthma   . Hay fever     Past Surgical History  Procedure Laterality Date  . No past surgeries      Social History   Social History  . Marital Status: Legally Separated    Spouse Name: N/A  . Number of Children: 2  . 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 Smoking status: Former Smoker -- 0.25 packs/day    Types:  Cigarettes  . Smokeless tobacco: Never Used     Comment: quit ~ 2014   . Alcohol Use: 3.6 oz/week    6 Cans of beer per week     Comment: Every other day  . Drug Use: No     Comment: no marihuana in > 1 year  . Sexual Activity: Not on file   Other Topics Concern  . Not on file   Social History Narrative   12 grade education   Lives w/ wife      Family History  Problem Relation Age of Onset  . Colon cancer Neg Hx   . Prostate cancer Neg Hx   . CAD Neg Hx   . Diabetes Father        Medication List       This list is accurate as of: 08/08/15  3:36 PM.  Always use your most recent med list.               albuterol 108 (90 Base) MCG/ACT inhaler  Commonly known as:  PROAIR HFA  Inhale 1-2 puffs into the lungs every 6 (six) hours as needed for wheezing or shortness of breath.     nystatin-triamcinolone ointment  Commonly known as:  MYCOLOG  Apply 1 application topically 2 (two) times daily.           Objective:   Physical Exam BP 102/66 mmHg  Pulse 76  Temp(Src) 98 F (36.7 C) (Oral)  Ht 5\' 9"  (1.753 m)  Wt 239 lb 8 oz (108.636 kg)  BMI 35.35 kg/m2  SpO2 97%   General:   Well developed, well nourished . NAD.  Neck: No  thyromegaly , normal carotid pulse HEENT:  Normocephalic . Face symmetric, atraumatic Neck: No thyromegaly on today's exam Lungs:  CTA B Normal respiratory effort, no intercostal retractions, no accessory muscle use. Heart: RRR,  no murmur.  No pretibial edema bilaterally  Abdomen:  Not distended, soft, non-tender. No rebound or rigidity.   Skin: Exposed areas without rash. Not pale. Not jaundice Neurologic:  alert & oriented X3.  Speech normal, gait appropriate for age and unassisted Strength symmetric and appropriate for age.  Psych: Cognition and judgment appear intact.  Cooperative with normal attention span and concentration.  Behavior appropriate. No anxious or depressed appearing.    Assessment & Plan:    Assessment Asthma Allergies Goiter-- per [place] 06-2013 ,dominant R nodule. BX (-) 06-2013  Plan Asthma: Uses albuterol rarely. We'll control, declined pneumonia shot, recommend flu shot yearly Goiter: Check a ultrasound. Exam today is actually normal. Chest pain, epigastric pain; Better with PPIs, did not pursue labs the last time he was here,continue with Prilosec for 4 weeks and improve diet. RTC 2 years, as he is very stable.

## 2015-08-08 NOTE — Assessment & Plan Note (Addendum)
Td 2011 Declined pnm shot  Never had cscope  Labs Diet-exercise--iscussed

## 2015-08-08 NOTE — Patient Instructions (Signed)
GO TO THE LAB :      Get the blood work     GO TO THE FRONT DESK Schedule your next appointment for a  Physical  When?   In 2 years  Fasting?  Yes    Take over-the-counter Prilosec 20 mg one tablet before bedtime for  4 weeks. If you continue with acid reflux please let us know

## 2015-08-08 NOTE — Progress Notes (Signed)
Pre visit review using our clinic review tool, if applicable. No additional management support is needed unless otherwise documented below in the visit note. 

## 2015-08-09 DIAGNOSIS — Z09 Encounter for follow-up examination after completed treatment for conditions other than malignant neoplasm: Secondary | ICD-10-CM | POA: Insufficient documentation

## 2015-08-09 LAB — LIPID PANEL
CHOL/HDL RATIO: 3
Cholesterol: 173 mg/dL (ref 0–200)
HDL: 64.3 mg/dL (ref >39.00)
LDL Cholesterol: 93 mg/dL (ref 0–99)
NONHDL: 109.12
Triglycerides: 80 mg/dL (ref 0.0–149.0)
VLDL: 16 mg/dL (ref 0.0–40.0)

## 2015-08-09 LAB — BASIC METABOLIC PANEL
BUN: 16 mg/dL (ref 6–23)
CHLORIDE: 102 meq/L (ref 96–112)
CO2: 30 meq/L (ref 19–32)
CREATININE: 1.09 mg/dL (ref 0.40–1.50)
Calcium: 9.4 mg/dL (ref 8.4–10.5)
GFR: 96.29 mL/min (ref >60.00)
GLUCOSE: 98 mg/dL (ref 70–99)
Potassium: 5 mEq/L (ref 3.5–5.1)
Sodium: 138 mEq/L (ref 135–145)

## 2015-08-09 LAB — TSH: TSH: 1.47 u[IU]/mL (ref 0.35–4.50)

## 2015-08-09 LAB — ALT: ALT: 21 U/L (ref 0–53)

## 2015-08-09 LAB — AST: AST: 20 U/L (ref 0–37)

## 2015-08-09 LAB — HEMOGLOBIN A1C: HEMOGLOBIN A1C: 5.7 % (ref 4.6–6.5)

## 2015-08-09 NOTE — Assessment & Plan Note (Signed)
Asthma: Uses albuterol rarely. We'll control, declined pneumonia shot, recommend flu shot yearly Goiter: Check a ultrasound. Exam today is actually normal. Chest pain, epigastric pain; Better with PPIs, did not pursue labs the last time he was here,continue with Prilosec for 4 weeks and improve diet. RTC 2 years, as he is very stable.

## 2016-03-14 ENCOUNTER — Telehealth: Payer: Self-pay | Admitting: Internal Medicine

## 2016-03-14 DIAGNOSIS — D234 Other benign neoplasm of skin of scalp and neck: Secondary | ICD-10-CM

## 2016-03-14 NOTE — Telephone Encounter (Signed)
Please advise 

## 2016-03-14 NOTE — Telephone Encounter (Signed)
Relation to [id number] Call back Spencer   Reason for call:  Patient request referral to surgeon to remove cyst on right side of head, patient states PCP is aware of cyst. Please advise

## 2016-03-15 NOTE — Telephone Encounter (Signed)
Referral placed.

## 2016-03-15 NOTE — Telephone Encounter (Signed)
Has a scalp cyst, see visit from 2015. Okay to refer to general surgery for possible excision

## 2016-04-16 ENCOUNTER — Telehealth: Payer: Self-pay | Admitting: Internal Medicine

## 2016-04-16 NOTE — Telephone Encounter (Signed)
Chart reviewed, I don't  see a medical condition (acne, scars, etc) that prevents him from shaving frequently.

## 2016-04-16 NOTE — Telephone Encounter (Signed)
Patient is getting ready to start a new job and it requires him to shave once a week. However, he stated that when he shaves too much his skin gets irritated. He would like to know if he can get a note to excuse him from this and give him permission to wear a beard guard. Please advise.   Patient phone: [phone]

## 2016-04-16 NOTE — Telephone Encounter (Signed)
Spoke w/ Pt, informed him of response. Instructed if he would like to schedule an appt to discuss w/ Dr. Larose Kells he certainly can but for now we would be unable to provide letter. Pt verbalized understanding.

## 2016-04-16 NOTE — Telephone Encounter (Signed)
Last OV 07/2015, would Pt need OV to discuss?

## 2016-08-22 ENCOUNTER — Ambulatory Visit (INDEPENDENT_AMBULATORY_CARE_PROVIDER_SITE_OTHER): Payer: BLUE CROSS/BLUE SHIELD | Admitting: Internal Medicine

## 2016-08-22 ENCOUNTER — Encounter (HOSPITAL_BASED_OUTPATIENT_CLINIC_OR_DEPARTMENT_OTHER): Payer: Self-pay

## 2016-08-22 ENCOUNTER — Ambulatory Visit (HOSPITAL_BASED_OUTPATIENT_CLINIC_OR_DEPARTMENT_OTHER): Admission: RE | Admit: 2016-08-22 | Payer: BLUE CROSS/BLUE SHIELD | Source: Ambulatory Visit

## 2016-08-22 ENCOUNTER — Encounter: Payer: Self-pay | Admitting: Internal Medicine

## 2016-08-22 VITALS — BP 122/72 | HR 101 | Temp 97.6°F | Resp 14 | Ht 69.0 in | Wt 247.5 lb

## 2016-08-22 DIAGNOSIS — E041 Nontoxic single thyroid nodule: Secondary | ICD-10-CM

## 2016-08-22 DIAGNOSIS — R739 Hyperglycemia, unspecified: Secondary | ICD-10-CM

## 2016-08-22 DIAGNOSIS — R42 Dizziness and giddiness: Secondary | ICD-10-CM

## 2016-08-22 LAB — CBC WITH DIFFERENTIAL/PLATELET
BASOS PCT: 0.6 % (ref 0.0–3.0)
Basophils Absolute: 0.1 10*3/uL (ref 0.0–0.1)
EOS PCT: 3.4 % (ref 0.0–5.0)
Eosinophils Absolute: 0.3 10*3/uL (ref 0.0–0.7)
HEMATOCRIT: 47.9 % (ref 39.0–52.0)
Hemoglobin: 16.5 g/dL (ref 13.0–17.0)
LYMPHS ABS: 2.2 10*3/uL (ref 0.7–4.0)
LYMPHS PCT: 23.7 % (ref 12.0–46.0)
MCHC: 34.3 g/dL (ref 30.0–36.0)
MCV: 89.9 fl (ref 78.0–100.0)
MONOS PCT: 8.6 % (ref 3.0–12.0)
Monocytes Absolute: 0.8 10*3/uL (ref 0.1–1.0)
NEUTROS ABS: 5.9 10*3/uL (ref 1.4–7.7)
Neutrophils Relative %: 63.7 % (ref 43.0–77.0)
PLATELETS: 217 10*3/uL (ref 150.0–400.0)
RBC: 5.33 Mil/uL (ref 4.22–5.81)
RDW: 13 % (ref 11.5–15.5)
WBC: 9.3 10*3/uL (ref 4.0–10.5)

## 2016-08-22 LAB — SEDIMENTATION RATE: Sed Rate: 18 mm/hr — ABNORMAL HIGH (ref 0–15)

## 2016-08-22 LAB — HEMOGLOBIN A1C: Hgb A1c MFr Bld: 6.1 % (ref 4.6–6.5)

## 2016-08-22 MED ORDER — MECLIZINE HCL 12.5 MG PO TABS: 12.5000 mg | ORAL_TABLET | Freq: Three times a day (TID) | ORAL | 0 refills | Status: DC | PRN

## 2016-08-22 NOTE — Progress Notes (Signed)
Pre visit review using our clinic review tool, if applicable. No additional management support is needed unless otherwise documented below in the visit note. 

## 2016-08-22 NOTE — Progress Notes (Signed)
Subjective:    Patient ID: Jerome Barker, male    DOB: Nov 12, 1975, 41 y.o.   MRN: [id number]  DOS:  08/22/2016 Type of visit - description : acute Interval history: Chief complaint today is dizziness: Described as imbalance, not spinning. Symptoms started 10 days ago, they are mild but steady. They are worse sometimes by moving his head. Not necessarily worse when he stand up or turn in bed. Had similar symptoms to 3 years ago that self resolved.  Thyromegaly: Last ultrasound referral  failed.   Review of Systems Denies any headache, syncope. No diplopia but some blurred vision with the onset of dizziness. No slurred speech or focal deficits No photophobia per se but has a ill-defined pressure behind the eyes.  Past Medical History:  Diagnosis Date  . Asthma   . Hay fever     Past Surgical History:  Procedure Laterality Date  . NO PAST SURGERIES      Social History   Social History  . Marital status: Legally Separated    Spouse name: N/A  . Number of children: 2  . Years of education: N/A   Occupational History  . cook [employer]   Social History Main Topics  . Smoking status: Light Tobacco Smoker    Packs/day: 0.25    Types: Cigarettes  . Smokeless tobacco: Never Used     Comment: casual smoker   . Alcohol use 3.6 oz/week    6 Cans of beer per week     Comment: Every other day  . Drug use: No     Comment: occ marihuana    . Sexual activity: Not on file   Other Topics Concern  . Not on file   Social History Narrative   12 grade education   Lives w/ wife       Allergies as of 08/22/2016      Reactions   Prednisone Other (See Comments)   Flu-like symptoms (any other steroids)      Medication List       Accurate as of 08/22/16  2:45 PM. Always use your most recent med list.          meclizine 12.5 MG tablet Commonly known as:  ANTIVERT Take 1 tablet (12.5 mg total) by mouth 3 (three) times daily as needed for dizziness.            Objective:   Physical Exam BP 122/72 (BP Location: Left Arm, Patient Position: Sitting, Cuff Size: Normal)   Pulse (!) 101   Temp 97.6 F (36.4 C) (Oral)   Resp 14   Ht 5\' 9"  (1.753 m)   Wt 247 lb 8 oz (112.3 kg)   SpO2 98%   BMI 36.55 kg/m  General:   Well developed, well nourished . NAD.  Neck: I feel a R sided thyroid nodule, firm, nontender  ~ 1.5 cm HEENT:  Normocephalic . Face symmetric, atraumatic Lungs:  CTA B Normal respiratory effort, no intercostal retractions, no accessory muscle use. Heart: RRR,  no murmur.  No pretibial edema bilaterally  Skin: Not pale. Not jaundice Neurologic:  alert & oriented X3.  Speech normal, gait appropriate for age and unassisted EOMI, pupils equal and reactive DTRs symmetric Psych--  Cognition and judgment appear intact.  Cooperative with normal attention span and concentration.  Behavior appropriate. No anxious or depressed appearing.      Assessment & Plan:   Assessment Asthma Allergies Goiter-- per [place] 06-2013 ,dominant R nodule. BX (-) 06-2013  PLAN: Dizziness: As described above, sxs are not clearly peripheral. Had a similar episode a few years ago. Plan: Refer to neurology, further eval?. Check a CBC and sedimentation rate. Requests something for sx treatment, prescribe Antivert, watch for drowsiness. Thyroid nodule: the physical exam last year was (-) and referral for a [place] failed. Today I'm able to palpated the nodule again at the R side of the thyroid. Will check ultrasound. Explained the patient the need to check ultrasound serially. Mild hyperglycemia: Recheck a A1c.

## 2016-08-22 NOTE — Assessment & Plan Note (Signed)
Dizziness: As described above, sxs are not clearly peripheral. Had a similar episode a few years ago. Plan: Refer to neurology, further eval?. Check a CBC and sedimentation rate. Requests something for sx treatment, prescribe Antivert, watch for drowsiness. Thyroid nodule: the physical exam last year was (-) and referral for a [place] failed. Today I'm able to palpated the nodule again at the R side of the thyroid. Will check ultrasound. Explained the patient the need to check ultrasound serially. Mild hyperglycemia: Recheck a A1c.

## 2016-08-22 NOTE — Patient Instructions (Signed)
GO TO THE LAB : Get the blood work     Will refer you to the neurologist  We'll schedule ultrasound  If you have severe symptoms, please call this office.

## 2016-08-27 ENCOUNTER — Encounter: Payer: Self-pay | Admitting: Neurology

## 2016-10-11 ENCOUNTER — Emergency Department (HOSPITAL_COMMUNITY)
Admission: EM | Admit: 2016-10-11 | Discharge: 2016-10-11 | Disposition: A | Discharge status: Home / Self Care | Payer: BLUE CROSS/BLUE SHIELD | Attending: Emergency Medicine | Admitting: Emergency Medicine

## 2016-10-11 ENCOUNTER — Encounter (HOSPITAL_COMMUNITY): Payer: Self-pay | Admitting: Emergency Medicine

## 2016-10-11 ENCOUNTER — Emergency Department (HOSPITAL_COMMUNITY): Payer: BLUE CROSS/BLUE SHIELD

## 2016-10-11 DIAGNOSIS — J45909 Unspecified asthma, uncomplicated: Secondary | ICD-10-CM | POA: Diagnosis not present

## 2016-10-11 DIAGNOSIS — F17210 Nicotine dependence, cigarettes, uncomplicated: Secondary | ICD-10-CM | POA: Insufficient documentation

## 2016-10-11 DIAGNOSIS — R072 Precordial pain: Secondary | ICD-10-CM

## 2016-10-11 DIAGNOSIS — R51 Headache: Secondary | ICD-10-CM | POA: Insufficient documentation

## 2016-10-11 DIAGNOSIS — R079 Chest pain, unspecified: Secondary | ICD-10-CM | POA: Diagnosis not present

## 2016-10-11 DIAGNOSIS — R519 Headache, unspecified: Secondary | ICD-10-CM

## 2016-10-11 LAB — BASIC METABOLIC PANEL
Anion gap: 7 (ref 5–15)
BUN: 10 mg/dL (ref 6–20)
CALCIUM: 9.2 mg/dL (ref 8.9–10.3)
CO2: 25 mmol/L (ref 22–32)
CREATININE: 0.91 mg/dL (ref 0.61–1.24)
Chloride: 103 mmol/L (ref 101–111)
Glucose, Bld: 126 mg/dL — ABNORMAL HIGH (ref 65–99)
Potassium: 3.9 mmol/L (ref 3.5–5.1)
SODIUM: 135 mmol/L (ref 135–145)

## 2016-10-11 LAB — CBC
HCT: 43.9 % (ref 39.0–52.0)
Hemoglobin: 15.5 g/dL (ref 13.0–17.0)
MCH: 31.1 pg (ref 26.0–34.0)
MCHC: 35.3 g/dL (ref 30.0–36.0)
MCV: 88.2 fL (ref 78.0–100.0)
PLATELETS: 168 10*3/uL (ref 150–400)
RBC: 4.98 MIL/uL (ref 4.22–5.81)
RDW: 12.6 % (ref 11.5–15.5)
WBC: 8.1 10*3/uL (ref 4.0–10.5)

## 2016-10-11 LAB — I-STAT TROPONIN, ED
TROPONIN I, POC: 0 ng/mL (ref 0.00–0.08)
Troponin i, poc: 0 ng/mL (ref 0.00–0.08)

## 2016-10-11 MED ORDER — PROCHLORPERAZINE EDISYLATE 5 MG/ML IJ SOLN
10.0000 mg | Freq: Once | INTRAMUSCULAR | Status: AC
  Administered 2016-10-11: 10 mg via INTRAVENOUS
  Filled 2016-10-11: qty 2

## 2016-10-11 MED ORDER — SODIUM CHLORIDE 0.9 % IV BOLUS (SEPSIS)
1000.0000 mL | Freq: Once | INTRAVENOUS | Status: AC
  Administered 2016-10-11: 1000 mL via INTRAVENOUS

## 2016-10-11 MED ORDER — KETOROLAC TROMETHAMINE 30 MG/ML IJ SOLN
30.0000 mg | Freq: Once | INTRAMUSCULAR | Status: AC
  Administered 2016-10-11: 30 mg via INTRAVENOUS
  Filled 2016-10-11: qty 1

## 2016-10-11 NOTE — ED Triage Notes (Signed)
Rt side cp  On and off for a few months , h/a started this am and now both hand have some numbness and tingling that comes and goes, occ feels nauseated,  States no sob

## 2016-10-11 NOTE — ED Notes (Signed)
Pt verbalized understanding of d/c instructions and has no further questions. Pt is stable, A&Ox4, VSS.  

## 2016-10-11 NOTE — Discharge Instructions (Signed)
Your blood work was over all reassuring today.  Your blood pressure was slightly elevated, please contact your primary care provider and discuss your recently documented elevated blood pressure.  You may want to keep track of your blood pressure readings so you can make note of the trend. Also, discuss your recent, intermittent chest discomfort.  Your EKG, CXR and blood work was normal today, but your primary care provider may refer you to a cardiologist for further outpatient work up.   You may take ibuprofen or tylenol for headache as needed.   Return to the emergency department if you develop a "thundeclap" type headache, one that starts suddenly and it at its maximal intensity right away and is associated with changes in vision, nausea, vomiting, numbness or weakness to your extremities, speech or balance impediments.

## 2016-10-11 NOTE — ED Notes (Signed)
Pt eating and drinking

## 2016-10-11 NOTE — ED Provider Notes (Signed)
[facility] DEPT Provider Note   CSN: [id number] Arrival date & time: 10/11/16  1412     History   Chief Complaint Chief Complaint  Patient presents with  . Headache  . Chest Pain  . Numbness    HPI Jerome Barker is a 41 y.o. male with remote history of migraines presents to the ED with gradual in onset, dull headache that started this morning associated with photophobia and nausea. Patient reports intermittent blurred vision and bilateral hand tingling when "the pain is really bad". No head trauma. No history of hypertension. No neck pain or neck stiffness, rashes, preceding URI illness, no fever. No numbness or weakness in extremities. Patient has tried [place] powder and Aleve which has only minimally relieved the headache.   Patient also reports intermittent, non radiating, right sided chest pain for 2-3 months.  Non exertional. Not associated with meal intake.  Not associated with palpitations, light-headedness, SOB, nausea, vomiting or diaphoresis.  No known CAD or heart conditions.   HPI  Past Medical History:  Diagnosis Date  . Asthma   . Hay fever     Patient Active Problem List   Diagnosis Date Noted  . PCP NOTES >>>>>>>>>>>>>>>>>>>>>>> 08/09/2015  . Allergic rhinitis 08/11/2014  . Sinusitis, acute maxillary 08/11/2014  . Tinea pedis 08/11/2014  . Eczema of both hands 08/11/2014  . Pain in joint, shoulder region 11/30/2013  . Annual physical exam 07/02/2013  . Asthma   . Hay fever     Past Surgical History:  Procedure Laterality Date  . NO PAST SURGERIES         Home Medications    Prior to Admission medications   Medication Sig Start Date End Date Taking? Authorizing Provider  meclizine (ANTIVERT) 12.5 MG tablet Take 1 tablet (12.5 mg total) by mouth 3 (three) times daily as needed for dizziness. Patient not taking: Reported on 10/11/2016 08/22/16   Colon Branch, MD    Family History Family History  Problem Relation Age of Onset  . Diabetes  Father   . Colon cancer Neg Hx   . Prostate cancer Neg Hx   . CAD Neg Hx     Social History Social History  Substance Use Topics  . Smoking status: Light Tobacco Smoker    Packs/day: 0.25    Types: Cigarettes  . Smokeless tobacco: Never Used     Comment: casual smoker   . Alcohol use 3.6 oz/week    6 Cans of beer per week     Comment: Every other day     Allergies   Prednisone   Review of Systems Review of Systems  Constitutional: Negative for diaphoresis and fever.  HENT: Negative for congestion, postnasal drip, rhinorrhea and sneezing.   Eyes: Positive for photophobia and visual disturbance (resolved).  Respiratory: Negative for choking, chest tightness and shortness of breath.   Cardiovascular: Positive for chest pain (not currenty). Negative for palpitations and leg swelling.  Gastrointestinal: Negative for blood in stool, nausea and vomiting.  Genitourinary: Negative for difficulty urinating and dysuria.  Musculoskeletal: Negative for back pain and gait problem.  Skin: Negative for rash.  Neurological: Positive for headaches. Negative for tremors, seizures, syncope, speech difficulty, weakness, light-headedness and numbness.       Tingling     Physical Exam Updated Vital Signs BP (!) 132/92   Pulse 94   Temp 97.9 F (36.6 C) (Oral)   Resp 19   SpO2 96%   Physical Exam  Constitutional: He is  oriented to person, place, and time. He appears well-developed and well-nourished. No distress.  HENT:  Head: Normocephalic and atraumatic.  Mouth/Throat: Oropharynx is clear and moist. No oropharyngeal exudate.  Eyes: Conjunctivae and EOM are normal. Pupils are equal, round, and reactive to light.  Neck: Normal range of motion. Neck supple. No JVD present. No tracheal deviation present.  Cardiovascular: Normal rate, regular rhythm, normal heart sounds and intact distal pulses.   No murmur heard. SBP and HR wnl No JVD. Carotid pulses 2+ bilaterally without  bruits. Good S1 and S2. No extra sounds or murmurs. 2+ and symmetric radial, dorsalis pedis and posterior tibial pulses bilaterally.   Capillary refill brisk in upper extremities.  No lower extremity edema.  No orthopnea.  Good lung sounds without crackles.  Pulmonary/Chest: Effort normal and breath sounds normal. No respiratory distress. He has no wheezes. He has no rales.  Abdominal: Soft. Bowel sounds are normal. He exhibits no distension. There is no tenderness.  Musculoskeletal: Normal range of motion. He exhibits no deformity.  Lymphadenopathy:    He has no cervical adenopathy.  Neurological: He is alert and oriented to person, place, and time.  Skin: Skin is warm and dry. Capillary refill takes less than 2 seconds.  Psychiatric: He has a normal mood and affect. His behavior is normal. Judgment and thought content normal.  Nursing note and vitals reviewed.    ED Treatments / Results  Labs (all labs ordered are listed, but only abnormal results are displayed) Labs Reviewed  BASIC METABOLIC PANEL - Abnormal; Notable for the following:       Result Value   Glucose, Bld 126 (*)    All other components within normal limits  CBC  I-STAT TROPOININ, ED  Randolm Idol, ED    EKG  EKG Interpretation None       Radiology Dg Chest 2 View  Result Date: 10/11/2016 CLINICAL DATA:  Chest pain EXAM: CHEST  2 VIEW COMPARISON:  10/29/2008 FINDINGS: Normal heart size and stable mediastinal contours. There is no edema, consolidation, effusion, or pneumothorax. No acute osseous finding. IMPRESSION: No evidence of active disease. Electronically Signed   By: Monte Fantasia M.D.   On: 10/11/2016 15:01    Procedures Procedures (including critical care time)  Medications Ordered in ED Medications  sodium chloride 0.9 % bolus 1,000 mL (0 mLs Intravenous Stopped 10/11/16 1839)  prochlorperazine (COMPAZINE) injection 10 mg (10 mg Intravenous Given 10/11/16 1655)  ketorolac (TORADOL) 30  MG/ML injection 30 mg (30 mg Intravenous Given 10/11/16 1655)     Initial Impression / Assessment and Plan / ED Course  I have reviewed the triage vital signs and the nursing notes.  Pertinent labs & imaging results that were available during my care of the patient were reviewed by me and considered in my medical decision making (see chart for details).  Clinical Course as of Oct 12 2030  Fri Oct 11, 2016  1921 Re-evaluated patient. Lights were on, patient was really pleased as headache has completely resolved.   [CG]    Clinical Course User Index [CG] Kinnie Feil, PA-C   Patient is a 41 y.o. yo male with pertinent pmh of migraines presents with headache that started this morning. Associated with intermittent hand tingling and blurred vision when "pain is really bad", photophobia and nausea. Pt reports current headache is similar to his first migraine that he had when he was 42 yo.  Patient is without high-risk features of headache including: sudden onset/thunderclap HA,  altered mental status, accompanying seizure, headache with exertion, age > 37, history of immunocompromise, neck or shoulder pain, fever, use of anticoagulation, family history of spontaneous SAH, concomitant drug use or toxic exposure. On exam pt has vitals signs within normal limits, is well-appearing, no meningismus, no nystagmus, no focal neuro deficits, no pain over temporal arteries.  Imaging with CT/MRI not indicated given history and physical exam findings. No emergent intracranial or vascular pathology suspected or identified by history, physical exam.  No indications for hospitalization identified. Pt given migraine cocktail in emergency department with completel resolution of headache. Pt tolerated PO fluids and ambulated in ED without difficulty.   Strict ED return precautions given. Pt will be discharged with symptomatic treatment of headaches.  Pt is aware of red flag symptoms of headaches that would warrant  return to ED. Pt verbalized understanding and is agreeable to discharge plan.   Patient reported intermittent, short lasting, right sided chest pains x 2-3 months.  Pertinent risk factors include obesity only.  No previous known CAD.  On exam VS are within normal limits. RRR, symmetric pulses bilaterally.  Patient currently denies CP. CXR, EKG, troponin x 2 within normal limits.  CBC and BMP unremarkable.  Heart score = 1.  Patient is to be discharged with recommendation to follow up with PCP and/or cardiologist in regards to today's hospital visit. It is possible that chest pain could be due to cardiac etiology however given presentation, PERC negative, VS within normal limits and stable, no tracheal deviation, no JVD or new murmur, RRR, breath sounds equal bilaterally, and low risk HEART score, patient is low risk and is safe for discharge for outpatient cardiac work up.  Pt has been advised to return to the ED is CP becomes exertional, associated with diaphoresis or nausea, radiates to left jaw/arm, worsens or becomes concerning in any way. Pt appears reliable for follow up and is agreeable to discharge. Patient is in no acute distress. Vital Signs are within normal limits. Patient is able to ambulate. Patient able to tolerate PO.   Patient, ED treatment and discharge plan was discussed with supervising physician who also evaluated the patient and is agreeable with plan.   Final Clinical Impressions(s) / ED Diagnoses   Final diagnoses:  Bad headache  Precordial pain    New Prescriptions New Prescriptions   No medications on file     Arlean Hopping 10/11/16 2032    Merrily Pew, MD 10/12/16 2253

## 2016-10-29 ENCOUNTER — Ambulatory Visit: Payer: BLUE CROSS/BLUE SHIELD | Admitting: Neurology

## 2017-01-14 ENCOUNTER — Encounter: Payer: Self-pay | Admitting: Physician Assistant

## 2017-01-14 ENCOUNTER — Telehealth: Payer: Self-pay | Admitting: Internal Medicine

## 2017-01-14 ENCOUNTER — Ambulatory Visit (INDEPENDENT_AMBULATORY_CARE_PROVIDER_SITE_OTHER): Payer: BLUE CROSS/BLUE SHIELD | Admitting: Physician Assistant

## 2017-01-14 VITALS — BP 116/78 | HR 99 | Temp 98.3°F | Wt 244.2 lb

## 2017-01-14 DIAGNOSIS — R42 Dizziness and giddiness: Secondary | ICD-10-CM

## 2017-01-14 DIAGNOSIS — R202 Paresthesia of skin: Secondary | ICD-10-CM | POA: Diagnosis not present

## 2017-01-14 DIAGNOSIS — F419 Anxiety disorder, unspecified: Secondary | ICD-10-CM | POA: Diagnosis not present

## 2017-01-14 DIAGNOSIS — Z833 Family history of diabetes mellitus: Secondary | ICD-10-CM

## 2017-01-14 DIAGNOSIS — R7309 Other abnormal glucose: Secondary | ICD-10-CM

## 2017-01-14 LAB — GLUCOSE, POCT (MANUAL RESULT ENTRY): POC GLUCOSE: 5.6 mg/dL — AB (ref 70–99)

## 2017-01-14 NOTE — Telephone Encounter (Signed)
Pt called in to schedule an apt. He said that he has been having tingling in his hand. Transferred pt to team health for triaging.

## 2017-01-14 NOTE — Telephone Encounter (Signed)
Patient Name: Jerome Barker DOB: [birth year] Initial Comment Caller says, pain in finger and toe, and feels dizzy, thinks it may be sugar level, but he is not diabetic. Nurse Assessment Nurse: Joline Salt, RN, Malachy Mood Date/Time Eilene Ghazi Time): 01/14/2017 12:57:39 PM Confirm and document reason for call. If symptomatic, describe symptoms. ---Caller states he is having pains in fingers of both hands and toes of left foot for a couple days. Also feels dizzy. Thinks it may be sugar level, but he is not diabetic. His dad is diabetic and had pain in fingers and toes. Does the patient have any new or worsening symptoms? ---Yes Will a triage be completed? ---Yes Related visit to physician within the last 2 weeks? ---No Does the PT have any chronic conditions? (i.e. diabetes, asthma, etc.) ---Yes List chronic conditions. ---asthma Is this a behavioral health or substance abuse call? ---No Guidelines Guideline Title Affirmed Question Affirmed Notes Dizziness - Lightheadedness Dizziness caused by poor fluid intake Finger Pain [1] Numbness (i.e., loss of sensation) in hand or fingers AND [2] new-onset Final Disposition User See Physician within [address] Hours Halsey, RN, [place] Comments Appt made with Dr Inda Coke, at [address]: [place]. @430  01/14/17 Referrals REFERRED TO PCP OFFICE Disagree/Comply: Comply Disagree/Comply: Comply

## 2017-01-14 NOTE — Patient Instructions (Addendum)
It was great to meet you!  Please make a follow-up with Dr. Larose Kells to discuss today's issues as well as schedule a physical.  Work on decreasing sugary beverages -- vitamin water often contains a lot of sugar, you need to avoid this.  Think about anxiety medications, we can resume what you were on before if needed.

## 2017-01-14 NOTE — Progress Notes (Signed)
Jerome Barker is a 41 y.o. male here for a new problem.   History of Present Illness:   Chief Complaint  Patient presents with  . Toe Pain    X3days   . Hand Pain    X3days     HPI   Patient is here to discuss lightheadedness, concern for DM, and hand/foot "pins and needles sensation." He reports that he has been experiencing vertigo for months, has been treated with meclizine and dramamine successfully in the past by his PCP. Denies any new or unusual symptoms with his vertigo. Denies any numbness on one side of body, no tingling sensation. Does have frequent headaches, but none that have been unusual. Has had overall weight gain for the past 1.5 years. Has been working on diet in efforts to lose weight. Has tried to decrease his soda consumption and drinks Vitamin Water (with sugar) regularly. Has been diagnosed with carpal tunnel in the past and confirms that he is having similar symptoms now with his fingers. Denies frequent urination. No excessive thirst. Does have health anxiety; states his wife has noted that he has excessive worry about his personal health issues. He reports that his dad was dx with DM in the past because he was having "feet pain." Patient reports that he has "pins and needles" sensation in his hands and feet over the past few days, which has prompted him to seek medical attention today. He is very worried that he has DM. Last HgbA1c 4 months ago was 6.1.   Wt Readings from Last 5 Encounters:  01/14/17 244 lb 3.2 oz (110.8 kg)  08/22/16 247 lb 8 oz (112.3 kg)  08/08/15 239 lb 8 oz (108.6 kg)  07/04/15 240 lb 4 oz (109 kg)  08/11/14 222 lb (100.7 kg)     Past Medical History:  Diagnosis Date  . Asthma   . Hay fever      Social History   Social History  . Marital status: Legally Separated    Spouse name: N/A  . Number of children: 2  . Years of education: N/A   Occupational History  . cook [employer]   Social History Main Topics  . Smoking  status: Light Tobacco Smoker    Packs/day: 0.25    Types: Cigarettes  . Smokeless tobacco: Never Used     Comment: casual smoker   . Alcohol use 3.6 oz/week    6 Cans of beer per week     Comment: Every other day  . Drug use: No     Comment: occ marihuana    . Sexual activity: Not on file   Other Topics Concern  . Not on file   Social History Narrative   12 grade education   Lives w/ wife     Past Surgical History:  Procedure Laterality Date  . NO PAST SURGERIES      Family History  Problem Relation Age of Onset  . Diabetes Father   . Colon cancer Neg Hx   . Prostate cancer Neg Hx   . CAD Neg Hx     Allergies  Allergen Reactions  . Prednisone Other (See Comments)    Flu-like symptoms (any other steroids)    Current Medications:   Current Outpatient Prescriptions:  .  meclizine (ANTIVERT) 12.5 MG tablet, Take 1 tablet (12.5 mg total) by mouth 3 (three) times daily as needed for dizziness., Disp: 21 tablet, Rfl: 0   Review of Systems:   ROS  Vitals:  Vitals:   01/14/17 1636  BP: 116/78  Pulse: 99  Temp: 98.3 F (36.8 C)  TempSrc: Oral  SpO2: 96%  Weight: 244 lb 3.2 oz (110.8 kg)     Body mass index is 36.06 kg/m.  Physical Exam:   Physical Exam  Constitutional: He appears well-developed. He is cooperative.  Non-toxic appearance. He does not have a sickly appearance. He does not appear ill. No distress.  Cardiovascular: Normal rate, regular rhythm, S1 normal, S2 normal, normal heart sounds and normal pulses.   No LE edema  Pulmonary/Chest: Effort normal and breath sounds normal.  Musculoskeletal:  Bilateral hands and toes without visible deformities. No decreased ROM or strength. No open areas on feet and normal sensation throughout. Phalen's and Tinel's negative.  Feet:  Right Foot:  Protective Sensation: 6 sites tested. 6 sites sensed.  Skin Integrity: Negative for ulcer, blister, skin breakdown, erythema or warmth.  Left Foot:  Protective  Sensation: 6 sites tested. 6 sites sensed.  Skin Integrity: Negative for ulcer, blister, skin breakdown, erythema or warmth.  Neurological: He is alert. He has normal strength. No cranial nerve deficit or sensory deficit. Coordination and gait normal. GCS eye subscore is 4. GCS verbal subscore is 5. GCS motor subscore is 6.  Skin: Skin is warm, dry and intact.  Psychiatric: He has a normal mood and affect. His speech is normal and behavior is normal.  Nursing note and vitals reviewed.  POC Glucose: 133 POC HgbA1c: 5.6   Assessment and Plan:    Wiliam was seen today for toe pain and hand pain.  Diagnoses and all orders for this visit:  Anxiety Patient endorses significant health anxiety. We discussed avoiding the internet to research symptoms or other concerns. He has a physical with his PCP scheduled at the middle of next month. He is considering talking to him about resuming his anti-depressant to help with his anxiety. I encouraged him to follow-up with his PCP sooner if he would like to discuss this sooner. I discussed with patient that if they develop any SI, to tell someone immediately and seek medical attention. We also discussed that anxiety can often manifest in physical sensations, including his "pins and needles sensations."  Family history of diabetes mellitus and elevated hemoglobin A1c Currently HgbA1c is 5.6. This is improved from last checked 4 months ago, when it was 6.1. Provided reassurance to patient. Advised him to continue to make dietary changes to keep weight in healthier range and to avoid sugary Vitamin Waters. Patient verbalized understanding. Follow-up with PCP next month for scheduled physical.  Dizziness No red flags on exam, provided reassurance. Continue to use prn meclizine as needed. We reviewed stroke signs. Follow-up with PCP if symptoms persist.  Pins and Needles Sensation Normal sensation throughout bilateral hands and feet, exam benign. Discussed  hydration and working on anxiety. I advised him to keep a record of his symptoms and review them with PCP at physical exam next month.   . Reviewed expectations re: course of current medical issues. . Discussed self-management of symptoms. . Outlined signs and symptoms indicating need for more acute intervention. . Patient verbalized understanding and all questions were answered. . See orders for this visit as documented in the electronic medical record. . Patient received an After-Visit Summary.  Inda Coke, PA-C

## 2017-01-15 LAB — POCT GLYCOSYLATED HEMOGLOBIN (HGB A1C): Hemoglobin A1C: 133

## 2017-02-10 ENCOUNTER — Encounter: Payer: Self-pay | Admitting: Internal Medicine

## 2017-02-10 ENCOUNTER — Ambulatory Visit (INDEPENDENT_AMBULATORY_CARE_PROVIDER_SITE_OTHER): Payer: BLUE CROSS/BLUE SHIELD | Admitting: Internal Medicine

## 2017-02-10 VITALS — BP 126/78 | HR 100 | Temp 97.8°F | Resp 14 | Ht 69.0 in | Wt 249.0 lb

## 2017-02-10 DIAGNOSIS — Z0000 Encounter for general adult medical examination without abnormal findings: Secondary | ICD-10-CM | POA: Diagnosis not present

## 2017-02-10 DIAGNOSIS — E041 Nontoxic single thyroid nodule: Secondary | ICD-10-CM

## 2017-02-10 NOTE — Progress Notes (Signed)
Subjective:    Patient ID: Jerome Barker, male    DOB: 05-Feb-1976, 41 y.o.   MRN: [id number]  DOS:  02/10/2017 Type of visit - description : cpx Interval history: Since last visit, he went to the ER with the headache and right-sided chest pain. Labs were okay, chest x-ray, troponins and EKG within normal. He responded very well to a headache cocktail and was discharged home. Today he does have some concerns, see below   Review of Systems Continue with on and off dizziness described as imbalance, interestingly the symptoms are more noticeable "with bad weather and decreased with Allegra-D". When he is exposed to "bad whether" he has mild sinus pressure at the frontal area. He again has occasionally right sided burning type of chest pain, does not change with eating or taking deep breath except when he eats very spicy foods, sx tend to be more noticeable.   Other than above, a 14 point review of systems is negative     Past Medical History:  Diagnosis Date  . Asthma   . Hay fever     Past Surgical History:  Procedure Laterality Date  . NO PAST SURGERIES      Social History   Social History  . Marital status: Legally Separated    Spouse name: N/A  . Number of children: 2  . Years of education: N/A   Occupational History  . cook    Social History Main Topics  . Smoking status: Former Smoker    Packs/day: 0.25    Types: Cigarettes    Quit date: 05/13/2015  . Smokeless tobacco: Never Used     Comment: quit  . Alcohol use 3.6 oz/week    6 Cans of beer per week     Comment: Every other day  . Drug use: No     Comment: occ marihuana    . Sexual activity: Not on file   Other Topics Concern  . Not on file   Social History Narrative   12 grade education   Lives w/ wife    Family History  Problem Relation Age of Onset  . Diabetes Father   . Colon cancer Neg Hx   . Prostate cancer Neg Hx   . CAD Neg Hx      Allergies as of 02/10/2017      Reactions   Prednisone Other (See Comments)   Flu-like symptoms (any other steroids)      Medication List       Accurate as of 02/10/17 11:59 PM. Always use your most recent med list.          fexofenadine 180 MG tablet Commonly known as:  ALLEGRA Take 180 mg by mouth daily.   meclizine 12.5 MG tablet Commonly known as:  ANTIVERT Take 1 tablet (12.5 mg total) by mouth 3 (three) times daily as needed for dizziness.            Discharge Care Instructions        Start     Ordered   02/10/17 0000  Comp Met (CMET)     02/10/17 1547   02/10/17 0000  Lipid panel     02/10/17 1547   02/10/17 0000  Hemoglobin A1c     02/10/17 1547   02/10/17 0000  TSH     02/10/17 1547   02/10/17 0000  US Soft Tissue Head/Neck    Question Answer Comment  Reason for Exam (SYMPTOM  OR DIAGNOSIS REQUIRED) R thyroid  nodule f/u   Preferred imaging location? [facility]      02/10/17 1548         Objective:   Physical Exam BP 126/78 (BP Location: Left Arm, Patient Position: Sitting, Cuff Size: Normal)   Pulse 100   Temp 97.8 F (36.6 C) (Oral)   Resp 14   Ht 5' 9"  (1.753 m)   Wt 249 lb (112.9 kg)   SpO2 98%   BMI 36.77 kg/m   General:   Well developed, well nourished . NAD.  Neck:  No LAD, on today's exam, questionable right side thyroid enlargement but no clear cut nodule. HEENT:  Normocephalic . Face symmetric, atraumatic Lungs:  CTA B Normal respiratory effort, no intercostal retractions, no accessory muscle use. Heart: RRR,  no murmur.  No pretibial edema bilaterally  Chest wall, no TTP Abdomen:  Not distended, soft, non-tender. No rebound or rigidity.   Skin: Exposed areas without rash. Not pale. Not jaundice Neurologic:  alert & oriented X3.  Speech normal, gait appropriate for age and unassisted Strength symmetric and appropriate for age.  Psych: Cognition and judgment appear intact.  Cooperative with normal attention span and concentration.  Behavior  appropriate. No anxious or depressed appearing.    Assessment & Plan:   Assessment Hyperglycemia: A1c 6. 05/29/2018 9T Asthma Allergies Goiter-- per [place] 06-2013 ,dominant R nodule. BX (-) 06-2013;   PLAN: History of thyroid nodule: Exam today with question of right-sided enlargement, check ultrasound, previous referal failed  Hyperglycemia: Labs today Asthma: Not an issue at this time Dizziness: As described above, "weather-related". He is taking Allegra-D with decreased sxs but rec to take plain Allegra and  use Flonase consistently. Okay meclizine if needed. (States he takes Allegra-D rarely, 2 or 3 times a month which is actually okay)   Right-sided chest pain: Heartburn? Recommend OTC Zantac. RTC 6 months

## 2017-02-10 NOTE — Assessment & Plan Note (Signed)
-  Td 2011 -diet and exercise discussed -Labs: CMP, FLP, A1c, TSH

## 2017-02-10 NOTE — Patient Instructions (Signed)
GO TO THE LAB : Get the blood work     GO TO THE FRONT DESK Schedule your next appointment for a  checkup in 6 months   For dizziness: Take Allegra as needed Flonase 2 sprays on each side of the nose every day  For right-sided chest pain: Take Zantac OTC 1 or 2 tablets a day as needed.

## 2017-02-10 NOTE — Progress Notes (Signed)
Pre visit review using our clinic review tool, if applicable. No additional management support is needed unless otherwise documented below in the visit note. 

## 2017-02-11 ENCOUNTER — Other Ambulatory Visit: Payer: Self-pay | Admitting: Internal Medicine

## 2017-02-11 LAB — COMPREHENSIVE METABOLIC PANEL
ALT: 19 U/L (ref 0–53)
AST: 19 U/L (ref 0–37)
Albumin: 4 g/dL (ref 3.5–5.2)
Alkaline Phosphatase: 54 U/L (ref 39–117)
BUN: 13 mg/dL (ref 6–23)
CHLORIDE: 99 meq/L (ref 96–112)
CO2: 29 meq/L (ref 19–32)
Calcium: 9.6 mg/dL (ref 8.4–10.5)
Creatinine, Ser: 1 mg/dL (ref 0.40–1.50)
GFR: 105.56 mL/min (ref >60.00)
Glucose, Bld: 85 mg/dL (ref 70–99)
POTASSIUM: 4.4 meq/L (ref 3.5–5.1)
SODIUM: 136 meq/L (ref 135–145)
Total Bilirubin: 0.7 mg/dL (ref 0.2–1.2)
Total Protein: 7.3 g/dL (ref 6.0–8.3)

## 2017-02-11 LAB — LIPID PANEL
CHOL/HDL RATIO: 2
CHOLESTEROL: 169 mg/dL (ref 0–200)
HDL: 70.4 mg/dL (ref >39.00)
LDL CALC: 85 mg/dL (ref 0–99)
NonHDL: 98.72
TRIGLYCERIDES: 68 mg/dL (ref 0.0–149.0)
VLDL: 13.6 mg/dL (ref 0.0–40.0)

## 2017-02-11 LAB — HEMOGLOBIN A1C: Hgb A1c MFr Bld: 6 % (ref 4.6–6.5)

## 2017-02-11 LAB — TSH: TSH: 1.33 u[IU]/mL (ref 0.35–4.50)

## 2017-02-11 NOTE — Addendum Note (Signed)
Addended byDamita Dunnings D on: 02/11/2017 12:25 PM   Modules accepted: Orders

## 2017-02-11 NOTE — Assessment & Plan Note (Signed)
History of thyroid nodule: Exam today with question of right-sided enlargement, check ultrasound, previous referal failed  Hyperglycemia: Labs today Asthma: Not an issue at this time Dizziness: As described above, "weather-related". He is taking Allegra-D with decreased sxs but rec to take plain Allegra and  use Flonase consistently. Okay meclizine if needed. (States he takes Allegra-D rarely, 2 or 3 times a month which is actually okay)   Right-sided chest pain: Heartburn? Recommend OTC Zantac. RTC 6 months

## 2017-04-21 DIAGNOSIS — L7211 Pilar cyst: Secondary | ICD-10-CM | POA: Diagnosis not present

## 2017-05-12 DIAGNOSIS — L7211 Pilar cyst: Secondary | ICD-10-CM | POA: Diagnosis not present

## 2017-05-12 DIAGNOSIS — L720 Epidermal cyst: Secondary | ICD-10-CM | POA: Diagnosis not present

## 2017-05-12 DIAGNOSIS — L989 Disorder of the skin and subcutaneous tissue, unspecified: Secondary | ICD-10-CM | POA: Diagnosis not present

## 2017-07-03 ENCOUNTER — Ambulatory Visit: Payer: BLUE CROSS/BLUE SHIELD | Admitting: Internal Medicine

## 2017-07-03 DIAGNOSIS — Z0289 Encounter for other administrative examinations: Secondary | ICD-10-CM

## 2017-07-04 ENCOUNTER — Encounter: Payer: Self-pay | Admitting: Internal Medicine

## 2017-07-04 ENCOUNTER — Ambulatory Visit (INDEPENDENT_AMBULATORY_CARE_PROVIDER_SITE_OTHER): Payer: BLUE CROSS/BLUE SHIELD | Admitting: Internal Medicine

## 2017-07-04 VITALS — BP 118/82 | HR 93 | Ht 69.0 in | Wt 255.0 lb

## 2017-07-04 DIAGNOSIS — R0683 Snoring: Secondary | ICD-10-CM

## 2017-07-04 DIAGNOSIS — R002 Palpitations: Secondary | ICD-10-CM | POA: Diagnosis not present

## 2017-07-04 NOTE — Progress Notes (Signed)
Subjective:    Patient ID: Jerome Barker, male    DOB: September 07, 1975, 42 y.o.   MRN: [id number]  DOS:  07/04/2017 Type of visit - description : Acute visit Interval history: Here with his wife, chief complaint is question of panic attacks. Symptoms started 4 days ago, random episodes of heart  fluttering, last 2 seconds, may have a few or several episodes during the day. No associated shortness of breath, chest pain, sweats, nausea or vomiting. Could be at rest, day, night or with exertion. Had similar episodes before.   Review of Systems When asked, admits that his a stress is at baseline, denies depression but admits that he in general is an anxious person. I asking about substance abuse, denies any illegal drugs, he admits to drinking, at least 2 beers every night. No tobacco No decongestant Denies any calf or leg swelling Admits to heavy snoring and feeling fatigued and tired throughout the day  Past Medical History:  Diagnosis Date  . Asthma   . Hay fever     Past Surgical History:  Procedure Laterality Date  . NO PAST SURGERIES      Social History   Socioeconomic History  . Marital status: Legally Separated    Spouse name: Not on file  . Number of children: 2  . Years of education: Not on file  . Highest education level: Not on file  Social Needs  . Financial resource strain: Not on file  . Food insecurity - worry: Not on file  . Food insecurity - inability: Not on file  . Transportation needs - medical: Not on file  . Transportation needs - non-medical: Not on file  Occupational History  . Occupation: cook  Tobacco Use  . Smoking status: Former Smoker    Packs/day: 0.25    Types: Cigarettes    Last attempt to quit: 05/13/2015    Years since quitting: 2.1  . Smokeless tobacco: Never Used  . Tobacco comment: quit  Substance and Sexual Activity  . Alcohol use: Yes    Alcohol/week: 3.6 oz    Types: 6 Cans of beer per week    Comment: Every other day  .  Drug use: No    Comment: occ marihuana    . Sexual activity: Not on file  Other Topics Concern  . Not on file  Social History Narrative   12 grade education   Lives w/ wife       Allergies as of 07/04/2017      Reactions   Prednisone Other (See Comments)   Flu-like symptoms (any other steroids)      Medication List        Accurate as of 07/04/17  3:09 PM. Always use your most recent med list.          fexofenadine 180 MG tablet Commonly known as:  ALLEGRA Take 180 mg by mouth daily.   meclizine 12.5 MG tablet Commonly known as:  ANTIVERT Take 1 tablet (12.5 mg total) by mouth 3 (three) times daily as needed for dizziness.          Objective:   Physical Exam BP 118/82   Pulse 93   Ht 5\' 9"  (1.753 m)   Wt 255 lb (115.7 kg)   SpO2 98%   BMI 37.66 kg/m  General:   Well developed, overweight. NAD.  HEENT:  Normocephalic . Face symmetric, atraumatic Lungs:  CTA B Normal respiratory effort, no intercostal retractions, no accessory muscle use. Heart: RRR,  no murmur.  No pretibial edema bilaterally  Skin: Not pale. Not jaundice Neurologic:  alert & oriented X3.  Speech normal, gait appropriate for age and unassisted Psych--  Cognition and judgment appear intact.  Cooperative with normal attention span and concentration.  Behavior appropriate. No anxious or depressed appearing.      Assessment & Plan:    Assessment Hyperglycemia: A1c 6. 05/29/2018 9T Asthma Allergies Goiter-- per [place] 06-2013 ,dominant R nodule. BX (-) 06-2013;   PLAN: Palpitations: Sxs started 4 days ago, similar episode 15 years ago on 4 years ago.  At the time they were more intense, he was prescribed SSRIs (sx felt to be anxiety)  for several days and eventually sx decreased. EKG today: Normal sinus rhythm. No red flag symptoms.  Last TSH satisfactory. Plan: For now we agreed on observation, if he continue having palpitations or severe sxs let me know.  Will consider SSRI and a  Holter. Snoring, fatigue: Epworth scale score 5, negative.   On further questioning, he feels that her main problem is that he has a difficult time falling asleep.  Recommend melatonin nightly and sleep tips all provided.  See instructions.  Reassess on RTC. EtOH: Recommend no more all than 2 servings of EtOH a day if any. RTC 2 months to reassess symptoms   Today, I spent more than  30  min with the patient: >50% of the time counseling regards  Good sleep habits, etoh moderation

## 2017-07-04 NOTE — Patient Instructions (Addendum)
  GO TO THE FRONT DESK Schedule your next appointment for a checkup in 2 months  Melatonin OTC : 4-8 mg every night to help with sleep  HEALTHY SLEEP Sleep hygiene: Basic rules for a good night's sleep  Sleep only as much as you need to feel rested and then get out of bed  Keep a regular sleep schedule  Avoid forcing sleep  Exercise regularly for at least 20 minutes, preferably 4 to 5 hours before bedtime  Avoid caffeinated beverages after lunch  Avoid alcohol near bedtime: no "night cap"  Avoid smoking, especially in the evening  Do not go to bed hungry  Adjust bedroom environment  Avoid prolonged use of light-emitting screens before bedtime   Deal with your worries before bedtime    Go back on a healthier diet, weight loss will significantly help your health.  Call if the "panic attacks" continue or get worse or if you have chest pain, difficulty breathing.

## 2017-07-05 DIAGNOSIS — R0683 Snoring: Secondary | ICD-10-CM | POA: Insufficient documentation

## 2017-07-05 NOTE — Assessment & Plan Note (Signed)
Palpitations: Sxs started 4 days ago, similar episode 15 years ago on 4 years ago.  At the time they were more intense, he was prescribed SSRIs (sx felt to be anxiety)  for several days and eventually sx decreased. EKG today: Normal sinus rhythm. No red flag symptoms.  Last TSH satisfactory. Plan: For now we agreed on observation, if he continue having palpitations or severe sxs let me know.  Will consider SSRI and a Holter. Snoring, fatigue: Epworth scale score 5, negative.   On further questioning, he feels that her main problem is that he has a difficult time falling asleep.  Recommend melatonin nightly and sleep tips all provided.  See instructions.  Reassess on RTC. EtOH: Recommend no more all than 2 servings of EtOH a day if any. RTC 2 months to reassess symptoms

## 2017-07-11 ENCOUNTER — Telehealth: Payer: Self-pay | Admitting: Internal Medicine

## 2017-07-11 DIAGNOSIS — R002 Palpitations: Secondary | ICD-10-CM

## 2017-07-11 MED ORDER — ESCITALOPRAM OXALATE 10 MG PO TABS: 10.0000 mg | ORAL_TABLET | Freq: Every day | ORAL | 0 refills | Status: DC

## 2017-07-11 NOTE — Telephone Encounter (Signed)
Copied from [place] [id number]. Topic: Quick Communication - See Telephone Encounter >> Jul 11, 2017  2:44 PM Conception Chancy, NT wrote: CRM for notification. See Telephone encounter for:  07/11/17.  Patient is calling states he is not better from last appt and is requesting a refill on his former anti-depressant medication. Patient unsure of name. Please advise.  [place], [place].

## 2017-07-11 NOTE — Telephone Encounter (Signed)
Per my last note, we agree that if he continue with palpitations we try a SSRI and a Holter monitor. 1.  Start Lexapro 10 mg, #30, no refills.  Advised about side effects including drowsiness, nausea and vomiting. 2.  Schedule Holter monitor, DX palpitations 3.  RTC 4 weeks

## 2017-07-11 NOTE — Telephone Encounter (Signed)
Please advise 

## 2017-07-11 NOTE — Telephone Encounter (Signed)
Tried calling Pt- no answer- unable to leave voice message. Will send Lexapro and order holter monitor and will try to call Pt again on return to office on Monday.

## 2017-07-11 NOTE — Telephone Encounter (Signed)
Sent back to provider

## 2017-07-14 NOTE — Telephone Encounter (Signed)
Copied from [place] [id number]. Topic: Quick Communication - See Telephone Encounter >> Jul 11, 2017  2:44 PM Conception Chancy, NT wrote: CRM for notification. See Telephone encounter for:  07/11/17. >> Jul 14, 2017 10:53 AM Neva Seat wrote: Pt called checking on Rx needing to be filled asap.

## 2017-07-14 NOTE — Telephone Encounter (Signed)
Pt informed Lexapro has been sent to [facility]. Informed of holter monitor- he does have this scheduled to pick up on 07/22/2017. Informed Pt to f/u in 4 weeks in office w/ PCP. Pt will call back to schedule.

## 2017-08-11 ENCOUNTER — Ambulatory Visit: Payer: BLUE CROSS/BLUE SHIELD | Admitting: Internal Medicine

## 2017-08-11 DIAGNOSIS — Z0289 Encounter for other administrative examinations: Secondary | ICD-10-CM

## 2017-08-26 ENCOUNTER — Encounter: Payer: Self-pay | Admitting: Internal Medicine

## 2018-01-15 ENCOUNTER — Other Ambulatory Visit: Payer: Self-pay

## 2018-01-15 ENCOUNTER — Emergency Department (HOSPITAL_BASED_OUTPATIENT_CLINIC_OR_DEPARTMENT_OTHER)
Admission: EM | Admit: 2018-01-15 | Discharge: 2018-01-15 | Disposition: A | Discharge status: Home / Self Care | Payer: BLUE CROSS/BLUE SHIELD | Attending: Emergency Medicine | Admitting: Emergency Medicine

## 2018-01-15 ENCOUNTER — Ambulatory Visit: Payer: Self-pay | Admitting: Internal Medicine

## 2018-01-15 ENCOUNTER — Emergency Department (HOSPITAL_BASED_OUTPATIENT_CLINIC_OR_DEPARTMENT_OTHER): Payer: BLUE CROSS/BLUE SHIELD

## 2018-01-15 ENCOUNTER — Encounter (HOSPITAL_BASED_OUTPATIENT_CLINIC_OR_DEPARTMENT_OTHER): Payer: Self-pay | Admitting: Emergency Medicine

## 2018-01-15 DIAGNOSIS — Z87891 Personal history of nicotine dependence: Secondary | ICD-10-CM | POA: Diagnosis not present

## 2018-01-15 DIAGNOSIS — R202 Paresthesia of skin: Secondary | ICD-10-CM | POA: Diagnosis not present

## 2018-01-15 DIAGNOSIS — J45909 Unspecified asthma, uncomplicated: Secondary | ICD-10-CM | POA: Diagnosis not present

## 2018-01-15 DIAGNOSIS — Z79899 Other long term (current) drug therapy: Secondary | ICD-10-CM | POA: Diagnosis not present

## 2018-01-15 DIAGNOSIS — R51 Headache: Secondary | ICD-10-CM

## 2018-01-15 DIAGNOSIS — R519 Headache, unspecified: Secondary | ICD-10-CM

## 2018-01-15 LAB — CBG MONITORING, ED: Glucose-Capillary: 124 mg/dL — ABNORMAL HIGH (ref 70–99)

## 2018-01-15 MED ORDER — ACETAMINOPHEN 500 MG PO TABS
1000.0000 mg | ORAL_TABLET | Freq: Once | ORAL | Status: AC
  Administered 2018-01-15: 1000 mg via ORAL
  Filled 2018-01-15: qty 2

## 2018-01-15 NOTE — ED Notes (Signed)
Pt. Reports he is a little stressed.

## 2018-01-15 NOTE — ED Provider Notes (Signed)
[facility] EMERGENCY DEPARTMENT Provider Note   CSN: [id number] Arrival date & time: 01/15/18  1653     History   Chief Complaint Chief Complaint  Patient presents with  . Numbness  . Headache    HPI Jerome Barker is a 42 y.o. male with history of asthma, headaches who presents with headache that began this afternoon to.  He reports he had a transient episode of left facial numbness and left hand numbness which is now resolved.  He took Motrin 600 mg with some good relief of his headache.  He rates his headache about a 3/10.  He reports he felt kind of out of it at the time.  He denies any vision changes, chest pain, nausea, vomiting, abdominal pain, weakness.  He reports that his headache feels like his normal headaches, however he has never had numbness with it before.  He has not seen a neurologist, although his primary care provider has referred him. He has been under a lot of stress.  HPI  Past Medical History:  Diagnosis Date  . Asthma   . Hay fever     Patient Active Problem List   Diagnosis Date Noted  . Snoring 07/05/2017  . PCP NOTES >>>>>>>>>>>>>>>>>>>>>>> 08/09/2015  . Allergic rhinitis 08/11/2014  . Sinusitis, acute maxillary 08/11/2014  . Tinea pedis 08/11/2014  . Eczema of both hands 08/11/2014  . Pain in joint, shoulder region 11/30/2013  . Annual physical exam 07/02/2013  . Asthma   . Hay fever     Past Surgical History:  Procedure Laterality Date  . NO PAST SURGERIES          Home Medications    Prior to Admission medications   Medication Sig Start Date End Date Taking? Authorizing Provider  escitalopram (LEXAPRO) 10 MG tablet Take 1 tablet (10 mg total) by mouth daily. 07/11/17   Colon Branch, MD  fexofenadine (ALLEGRA) 180 MG tablet Take 180 mg by mouth daily.    [provider]  meclizine (ANTIVERT) 12.5 MG tablet Take 1 tablet (12.5 mg total) by mouth 3 (three) times daily as needed for dizziness. 08/22/16   Colon Branch, MD    Family History Family History  Problem Relation Age of Onset  . Diabetes Father   . Colon cancer Neg Hx   . Prostate cancer Neg Hx   . CAD Neg Hx     Social History Social History   Tobacco Use  . Smoking status: Former Smoker    Packs/day: 0.25    Types: Cigarettes    Last attempt to quit: 05/13/2015    Years since quitting: 2.6  . Smokeless tobacco: Never Used  . Tobacco comment: quit  Substance Use Topics  . Alcohol use: Yes    Alcohol/week: 6.0 standard drinks    Types: 6 Cans of beer per week    Comment: Every other day  . Drug use: No    Frequency: 0.2 times per week    Comment: occ marihuana       Allergies   Prednisone   Review of Systems Review of Systems  Constitutional: Negative for chills and fever.  HENT: Negative for facial swelling and sore throat.   Eyes: Negative for visual disturbance.  Respiratory: Negative for shortness of breath.   Cardiovascular: Negative for chest pain.  Gastrointestinal: Negative for abdominal pain, nausea and vomiting.  Genitourinary: Negative for dysuria.  Musculoskeletal: Positive for neck pain. Negative for back pain.  Skin: Negative  for rash and wound.  Neurological: Positive for numbness and headaches. Negative for weakness.  Psychiatric/Behavioral: The patient is not nervous/anxious.      Physical Exam Updated Vital Signs BP 120/83 (BP Location: Right Arm)   Pulse 77   Temp 98.2 F (36.8 C) (Oral)   Resp 16   Ht 5\' 9"  (1.753 m)   Wt 111.1 kg   SpO2 99%   BMI 36.18 kg/m   Physical Exam  Constitutional: He appears well-developed and well-nourished. No distress.  HENT:  Head: Normocephalic and atraumatic.  Mouth/Throat: Oropharynx is clear and moist. No oropharyngeal exudate.  Eyes: Pupils are equal, round, and reactive to light. Conjunctivae are normal. Right eye exhibits no discharge. Left eye exhibits no discharge. No scleral icterus.  Neck: Normal range of motion. Neck supple. Muscular  tenderness present. No spinous process tenderness present. No neck rigidity. Normal range of motion present. No thyromegaly present.    Cardiovascular: Normal rate, regular rhythm, normal heart sounds and intact distal pulses. Exam reveals no gallop and no friction rub.  No murmur heard. Pulmonary/Chest: Effort normal and breath sounds normal. No stridor. No respiratory distress. He has no wheezes. He has no rales.  Abdominal: Soft. Bowel sounds are normal. He exhibits no distension. There is no tenderness. There is no rebound and no guarding.  Musculoskeletal: He exhibits no edema.  Lymphadenopathy:    He has no cervical adenopathy.  Neurological: He is alert. Coordination normal. GCS eye subscore is 4. GCS verbal subscore is 5. GCS motor subscore is 6.  CN 3-12 intact; normal sensation throughout; 5/5 strength in all 4 extremities; equal bilateral grip strength  Skin: Skin is warm and dry. No rash noted. He is not diaphoretic. No pallor.  Psychiatric: He has a normal mood and affect.  Nursing note and vitals reviewed.    ED Treatments / Results  Labs (all labs ordered are listed, but only abnormal results are displayed) Labs Reviewed  CBG MONITORING, ED - Abnormal; Notable for the following components:      Result Value   Glucose-Capillary 124 (*)    All other components within normal limits    EKG None  Radiology Ct Head Wo Contrast  Result Date: 01/15/2018 CLINICAL DATA:  Left facial and left hand numbness for 2-3 minutes. Headache. EXAM: CT HEAD WITHOUT CONTRAST TECHNIQUE: Contiguous axial images were obtained from the base of the skull through the vertex without intravenous contrast. COMPARISON:  None. FINDINGS: Brain: No evidence of acute infarction, hemorrhage, hydrocephalus, extra-axial collection or mass lesion/mass effect. Vascular: No hyperdense vessel or unexpected calcification. Skull: Normal. Negative for fracture or focal lesion. Sinuses/Orbits: No acute finding.  Other: 1.4 x 3.1 x 2.8 cm (AP by transverse by CC) frontal scalp subcutaneous lobulated mass with a few punctate calcifications. IMPRESSION: 1.  No acute intracranial abnormality. 2. 3.1 cm right frontal scalp subcutaneous mass may represent a trichilemmal cyst or pilomatricoma. Correlate with physical exam. Electronically Signed   By: Titus Dubin M.D.   On: 01/15/2018 17:45    Procedures Procedures (including critical care time)  Medications Ordered in ED Medications  acetaminophen (TYLENOL) tablet 1,000 mg (1,000 mg Oral Given 01/15/18 1837)     Initial Impression / Assessment and Plan / ED Course  I have reviewed the triage vital signs and the nursing notes.  Pertinent labs & imaging results that were available during my care of the patient were reviewed by me and considered in my medical decision making (see chart for details).  Patient presenting with headache with history of headaches, but new numbness which is now resolved.  CT head is negative.  Patient has normal neuro exam without focal deficits.  His headache is improved.  Suspect complicated migraine.  Patient's headache is typical of his normal headache pain. Patient advised to follow-up with neurology as he was directed by his PCP.  Strict return precautions discussed.  Patient understands and agrees with plan.  Patient vitals stable throughout ED course and discharged in satisfactory condition. I discussed patient case with Dr. Tyrone Nine who guided the patient's management and agrees with plan.   Final Clinical Impressions(s) / ED Diagnoses   Final diagnoses:  Paresthesia  Bad headache    ED Discharge Orders    None       Frederica Kuster, PA-C 01/15/18 Keeler, [place], DO 01/15/18 2339

## 2018-01-15 NOTE — Discharge Instructions (Addendum)
Please follow-up with neurology as soon as possible for further evaluation and treatment of your headaches.  It may be helpful to keep track of your headaches and a journal prior to your neurology appointment.  Keep track of things like the time of day, what is going on in your life, diet, exact symptoms you felt, etc.  Please return the emergency department if you develop any new or worsening symptoms including unresolving numbness or tingling, vision changes, weakness.

## 2018-01-15 NOTE — ED Triage Notes (Addendum)
Left facial and left hand numbness 1.5 hours PTA. Reports headache as well.  States "I feel jittery and discombobulated".  Ambulatory to triage in NAD.  Reports facial and hand numbness lasted approximately 2-3 minutes and then went away.

## 2018-01-15 NOTE — ED Notes (Signed)
Pt. Reports he has a headache at this time it started at 1530 today rates a 5/10  Behind the R eye.  Pt. Has history of migraine headaches.  Pt. Reports he take OTC meds for headaches and took Excedrin today.

## 2018-01-15 NOTE — Telephone Encounter (Signed)
Patient was rec  to go to the ER, please check on him tomorrow to see how he is doing

## 2018-01-15 NOTE — Telephone Encounter (Signed)
Patient took migraine medication(over the counter) and ate something- 20-30 minutes ago. No history of BP heart problems- does have history of migriane headaches.  Patient states he had rapid onset dizziness, confusion, finger numbness, and temporal tension/pain. Due to his symptoms- patient advised to go to ED for evaluation on symptoms.     Reason for Disposition . [1] Numbness (i.e., loss of sensation) of the face, arm / hand, or leg / foot on one side of the body AND [2] sudden onset AND [3] brief (now gone)  Answer Assessment - Initial Assessment Questions 1. SYMPTOM: "What is the main symptom you are concerned about?" (e.g., weakness, numbness)     Tension in temple- pain R- 6-7 2. ONSET: "When did this start?" (minutes, hours, days; while sleeping)     40 minutes ago 3. LAST NORMAL: "When was the last time you were normal (no symptoms)?"     40 minutes ago 4. PATTERN "Does this come and go, or has it been constant since it started?"  "Is it present now?"     Constant since symptoms started 5. CARDIAC SYMPTOMS: "Have you had any of the following symptoms: chest pain, difficulty breathing, palpitations?"     no 6. NEUROLOGIC SYMPTOMS: "Have you had any of the following symptoms: headache, dizziness, vision loss, double vision, changes in speech, unsteady on your feet?"     Dizziness- slight, vision changes- almost like a migriane, at first- balance off, numbness in hand- L 5th-4th finger started moved to other digits and left  7. OTHER SYMPTOMS: "Do you have any other symptoms?"     Nothing else 8. PREGNANCY: "Is there any chance you are pregnant?" "When was your last menstrual period?"     n/a  Protocols used: NEUROLOGIC DEFICIT-A-AH

## 2018-01-16 NOTE — Telephone Encounter (Signed)
thx

## 2018-01-16 NOTE — Telephone Encounter (Signed)
Spoke with patient he states he does feel better today. He states he was advised to see a Neurologist and will call PCP if he needs anything. He stated he will call for an ED follow up after his appointment with Neuro.

## 2018-03-02 DIAGNOSIS — L720 Epidermal cyst: Secondary | ICD-10-CM | POA: Diagnosis not present

## 2018-03-09 DIAGNOSIS — L723 Sebaceous cyst: Secondary | ICD-10-CM | POA: Diagnosis not present

## 2018-03-23 DIAGNOSIS — L723 Sebaceous cyst: Secondary | ICD-10-CM | POA: Diagnosis not present

## 2018-03-23 DIAGNOSIS — L720 Epidermal cyst: Secondary | ICD-10-CM | POA: Diagnosis not present

## 2018-04-07 ENCOUNTER — Encounter: Payer: Self-pay | Admitting: Internal Medicine

## 2018-04-07 ENCOUNTER — Ambulatory Visit (INDEPENDENT_AMBULATORY_CARE_PROVIDER_SITE_OTHER): Payer: BLUE CROSS/BLUE SHIELD | Admitting: Internal Medicine

## 2018-04-07 VITALS — BP 116/72 | HR 75 | Temp 98.2°F | Resp 16 | Ht 69.0 in | Wt 243.0 lb

## 2018-04-07 DIAGNOSIS — N529 Male erectile dysfunction, unspecified: Secondary | ICD-10-CM | POA: Diagnosis not present

## 2018-04-07 MED ORDER — SILDENAFIL CITRATE 20 MG PO TABS: 60.0000 mg | ORAL_TABLET | Freq: Every evening | ORAL | 3 refills | Status: DC | PRN

## 2018-04-07 NOTE — Patient Instructions (Signed)
GO TO THE FRONT DESK Schedule your next appointment for a  Physical, fasting , in 1 month

## 2018-04-07 NOTE — Progress Notes (Signed)
Pre visit review using our clinic review tool, if applicable. No additional management support is needed unless otherwise documented below in the visit note. 

## 2018-04-07 NOTE — Progress Notes (Signed)
Subjective:    Patient ID: Jerome Barker, male    DOB: [birth year], 42 y.o.   MRN: [id number]  DOS:  04/07/2018 Type of visit - description : f/u Interval history: Problem with erections for the last 2 to 3 years. Has a difficult time getting a erection and then sustain it Has tried some OTCs without much help.   Review of Systems Denies chest pain, difficulty breathing or palpitations No anxiety and depression, in fact he was taking Lexapro but self discontinued it and is feeling well emotionally. Has tried to exercise and lost some weight to see if that help with the ED but it did not. Libido is essentially normal  Past Medical History:  Diagnosis Date  . Asthma   . Hay fever     Past Surgical History:  Procedure Laterality Date  . NO PAST SURGERIES      Social History   Socioeconomic History  . Marital status: Married    Spouse name: Not on file  . Number of children: 2  . Years of education: Not on file  . Highest education level: Not on file  Occupational History  . Occupation: Training and development officer  Social Needs  . Financial resource strain: Not on file  . Food insecurity:    Worry: Not on file    Inability: Not on file  . Transportation needs:    Medical: Not on file    Non-medical: Not on file  Tobacco Use  . Smoking status: Former Smoker    Packs/day: 0.25    Types: Cigarettes    Last attempt to quit: 05/13/2015    Years since quitting: 2.9  . Smokeless tobacco: Never Used  . Tobacco comment: quit  Substance and Sexual Activity  . Alcohol use: Yes    Alcohol/week: 6.0 standard drinks    Types: 6 Cans of beer per week    Comment: Every other day  . Drug use: No    Frequency: 0.2 times per week    Comment: occ marijuana    . Sexual activity: Yes  Lifestyle  . Physical activity:    Days per week: Not on file    Minutes per session: Not on file  . Stress: Not on file  Relationships  . Social connections:    Talks on phone: Not on file    Gets together:  Not on file    Attends religious service: Not on file    Active member of club or organization: Not on file    Attends meetings of clubs or organizations: Not on file    Relationship status: Not on file  . Intimate partner violence:    Fear of current or ex partner: Not on file    Emotionally abused: Not on file    Physically abused: Not on file    Forced sexual activity: Not on file  Other Topics Concern  . Not on file  Social History Narrative   12 grade education   Lives w/ wife and daughter    Daughter 2007    Son 2001      Allergies as of 04/07/2018      Reactions   Prednisone Other (See Comments)   Flu-like symptoms (any other steroids)      Medication List        Accurate as of 04/07/18 11:59 PM. Always use your most recent med list.          fexofenadine 180 MG tablet Commonly known as:  ALLEGRA Take  180 mg by mouth daily.   sildenafil 20 MG tablet Commonly known as:  REVATIO Take 3-4 tablets (60-80 mg total) by mouth at bedtime as needed.          Objective:   Physical Exam BP 116/72 (BP Location: Left Arm, Patient Position: Sitting, Cuff Size: Normal)   Pulse 75   Temp 98.2 F (36.8 C) (Oral)   Resp 16   Ht 5\' 9"  (1.753 m)   Wt 243 lb (110.2 kg)   SpO2 98%   BMI 35.88 kg/m  General:   Well developed, NAD, BMI noted.  HEENT:  Normocephalic . Face symmetric, atraumatic Lungs:  CTA B Normal respiratory effort, no intercostal retractions, no accessory muscle use. Heart: RRR,  no murmur.  no pretibial edema bilaterally Normal femoral pulses Abdomen:  Not distended, soft, non-tender. No rebound or rigidity. GU: Testicles symmetric, nontender, approximately 4 cm Skin: Not pale. Not jaundice Neurologic:  alert & oriented X3.  Speech normal, gait appropriate for age and unassisted Psych--  Cognition and judgment appear intact.  Cooperative with normal attention span and concentration.  Behavior appropriate. No anxious or depressed  appearing.     Assessment & Plan:   Assessment Hyperglycemia: A1c 6. 05/29/2018  Asthma Allergies Goiter-- per [place] 06-2013 ,dominant R nodule. BX (-) 06-2013; failed to f/u on [place] Rx 2017  PLAN: ED: Symptoms of started ~ 2016, ROS essentially negative, doing well emotionally, feeling well physically.  Recommend first a trial with sildenafil 20 mg: 3, 4 tablets a day as needed.  Side effects and how to use it discussed. He will come back in a month for a physical exam and let me know how it is working for him.

## 2018-04-08 NOTE — Assessment & Plan Note (Signed)
ED: Symptoms of started ~ 2016, ROS essentially negative, doing well emotionally, feeling well physically.  Recommend first a trial with sildenafil 20 mg: 3, 4 tablets a day as needed.  Side effects and how to use it discussed. He will come back in a month for a physical exam and let me know how it is working for him.

## 2018-08-17 ENCOUNTER — Other Ambulatory Visit: Payer: Self-pay | Admitting: Internal Medicine

## 2018-08-17 ENCOUNTER — Telehealth: Payer: Self-pay | Admitting: Internal Medicine

## 2018-08-17 MED ORDER — SILDENAFIL CITRATE 20 MG PO TABS: ORAL_TABLET | ORAL | 3 refills | Status: DC

## 2018-08-17 NOTE — Telephone Encounter (Signed)
Copied from [place] [id number]. Topic: Quick Communication - Rx Refill/Question >> Aug 17, 2018  2:04 PM [place], [place] D wrote: Medication: sildenafil (REVATIO) 20 MG tablet / Pt needs Rx sent to a different [place] [facility]. Pt unable to get through to pharmacy on phone.  Has the patient contacted their pharmacy? Yes (Agent: If no, request that the patient contact the pharmacy for the refill.) (Agent: If yes, when and what did the pharmacy advise?)  Preferred Pharmacy (with phone number or street name): [place], [place] - [address] [phone] (Phone) [phone] (Fax)    Agent: Please be advised that RX refills may take up to 3 business days. We ask that you follow-up with your pharmacy.

## 2018-08-17 NOTE — Telephone Encounter (Signed)
Rx resent to new pharmacy.

## 2018-08-25 ENCOUNTER — Telehealth: Payer: Self-pay | Admitting: Internal Medicine

## 2018-08-25 NOTE — Telephone Encounter (Signed)
Spoke w/ Pt- letter placed at front desk for pick up. Made Pt aware if he is having resp sx's he will need to have someone else come to the office to pick up. Pt verbalized understanding.

## 2018-08-25 NOTE — Telephone Encounter (Signed)
Copied from [place] [id number]. Topic: Quick Communication - See Telephone Encounter >> Aug 25, 2018 11:47 AM Rutherford Nail, NT wrote: CRM for notification. See Telephone encounter for: 08/25/18. Patient calling and is requesting a 2 week leave of absense from work. Patient states that he has asthma and does not want to chance getting COVID 19. Would like to know if Dr Larose Kells would consent to writing him out of work for 2 weeks? CB#: [phone]

## 2018-08-25 NOTE — Telephone Encounter (Signed)
Okay, please prepare a letter

## 2018-08-25 NOTE — Telephone Encounter (Signed)
Please advise 

## 2018-10-02 ENCOUNTER — Encounter (HOSPITAL_COMMUNITY): Payer: Self-pay

## 2018-10-02 ENCOUNTER — Other Ambulatory Visit: Payer: Self-pay

## 2018-10-02 ENCOUNTER — Ambulatory Visit: Payer: Self-pay

## 2018-10-02 ENCOUNTER — Emergency Department (HOSPITAL_COMMUNITY)
Admission: EM | Admit: 2018-10-02 | Discharge: 2018-10-02 | Disposition: A | Discharge status: Home / Self Care | Payer: BLUE CROSS/BLUE SHIELD | Attending: Emergency Medicine | Admitting: Emergency Medicine

## 2018-10-02 ENCOUNTER — Emergency Department (HOSPITAL_COMMUNITY): Payer: BLUE CROSS/BLUE SHIELD

## 2018-10-02 DIAGNOSIS — R200 Anesthesia of skin: Secondary | ICD-10-CM | POA: Diagnosis not present

## 2018-10-02 DIAGNOSIS — H538 Other visual disturbances: Secondary | ICD-10-CM | POA: Insufficient documentation

## 2018-10-02 DIAGNOSIS — Z7982 Long term (current) use of aspirin: Secondary | ICD-10-CM | POA: Diagnosis not present

## 2018-10-02 DIAGNOSIS — F1290 Cannabis use, unspecified, uncomplicated: Secondary | ICD-10-CM | POA: Diagnosis not present

## 2018-10-02 DIAGNOSIS — R51 Headache: Secondary | ICD-10-CM | POA: Diagnosis not present

## 2018-10-02 DIAGNOSIS — Z87891 Personal history of nicotine dependence: Secondary | ICD-10-CM | POA: Insufficient documentation

## 2018-10-02 DIAGNOSIS — G43809 Other migraine, not intractable, without status migrainosus: Secondary | ICD-10-CM | POA: Diagnosis not present

## 2018-10-02 LAB — BASIC METABOLIC PANEL
Anion gap: 14 (ref 5–15)
BUN: 15 mg/dL (ref 6–20)
CO2: 22 mmol/L (ref 22–32)
Calcium: 9.3 mg/dL (ref 8.9–10.3)
Chloride: 99 mmol/L (ref 98–111)
Creatinine, Ser: 1.05 mg/dL (ref 0.61–1.24)
GFR calc Af Amer: >60 mL/min (ref >60)
GFR calc non Af Amer: >60 mL/min (ref >60)
Glucose, Bld: 102 mg/dL — ABNORMAL HIGH (ref 70–99)
Potassium: 4.4 mmol/L (ref 3.5–5.1)
Sodium: 135 mmol/L (ref 135–145)

## 2018-10-02 LAB — CBC WITH DIFFERENTIAL/PLATELET
Abs Immature Granulocytes: 0.02 10*3/uL (ref 0.00–0.07)
Basophils Absolute: 0 10*3/uL (ref 0.0–0.1)
Basophils Relative: 1 %
Eosinophils Absolute: 0.2 10*3/uL (ref 0.0–0.5)
Eosinophils Relative: 3 %
HCT: 47.1 % (ref 39.0–52.0)
Hemoglobin: 16.2 g/dL (ref 13.0–17.0)
Immature Granulocytes: 0 %
Lymphocytes Relative: 21 %
Lymphs Abs: 1.4 10*3/uL (ref 0.7–4.0)
MCH: 30.3 pg (ref 26.0–34.0)
MCHC: 34.4 g/dL (ref 30.0–36.0)
MCV: 88 fL (ref 80.0–100.0)
Monocytes Absolute: 0.7 10*3/uL (ref 0.1–1.0)
Monocytes Relative: 10 %
Neutro Abs: 4.5 10*3/uL (ref 1.7–7.7)
Neutrophils Relative %: 65 %
Platelets: 197 10*3/uL (ref 150–400)
RBC: 5.35 MIL/uL (ref 4.22–5.81)
RDW: 12.4 % (ref 11.5–15.5)
WBC: 6.9 10*3/uL (ref 4.0–10.5)
nRBC: 0 % (ref 0.0–0.2)

## 2018-10-02 MED ORDER — ASPIRIN EC 81 MG PO TBEC: 81.0000 mg | DELAYED_RELEASE_TABLET | Freq: Every day | ORAL | 0 refills | Status: AC

## 2018-10-02 MED ORDER — SODIUM CHLORIDE 0.9 % IV BOLUS
1000.0000 mL | Freq: Once | INTRAVENOUS | Status: AC
  Administered 2018-10-02: 1000 mL via INTRAVENOUS

## 2018-10-02 NOTE — Telephone Encounter (Signed)
Pt reported TIA like symptoms. For 10 minutes starting at 0700, pt had slurred speech, blurred vision to right eye, confusion,dizziness, numbness of right hand. At time of call, pt c/o headache bit no other symptoms. Pt advised to call wife to drive him to the ED now Pt worried about his job and is requesting an email to be sent to the email address on file.  Pt plans to go to [facility] ED.     Reason for Disposition . [1] Numbness (i.e., loss of sensation) of the face, arm / hand, or leg / foot on one side of the body AND [2] sudden onset AND [3] brief (now gone)  Answer Assessment - Initial Assessment Questions 1. SYMPTOM: "What is the main symptom you are concerned about?" (e.g., weakness, numbness)     Headacje, confusion, slurred speech, numness to left  2. ONSET: "When did this start?" (minutes, hours, days; while sleeping)     0700 today 3. LAST NORMAL: "When was the last time you were normal (no symptoms)?"     yesterday 4. PATTERN "Does this come and go, or has it been constant since it started?"  "Is it present now?"     Lasted 10 minutes- headache 5. CARDIAC SYMPTOMS: "Have you had any of the following symptoms: chest pain, difficulty breathing, palpitations?"     no 6. NEUROLOGIC SYMPTOMS: "Have you had any of the following symptoms: headache, dizziness, vision loss, double vision, changes in speech, unsteady on your feet?"     Headache, blurred vision right eye, dizziness, slurred speech, confusion, numbness right hand 7. OTHER SYMPTOMS: "Do you have any other symptoms?"     no 8. PREGNANCY: "Is there any chance you are pregnant?" "When was your last menstrual period?"     n/a  Protocols used: NEUROLOGIC DEFICIT-A-AH

## 2018-10-02 NOTE — ED Notes (Signed)
Patient transported to MRI 

## 2018-10-02 NOTE — ED Notes (Signed)
Patient transported to CT 

## 2018-10-02 NOTE — ED Provider Notes (Signed)
[facility] EMERGENCY DEPARTMENT Provider Note   CSN: [id number] Arrival date & time: 10/02/18  1035    History   Chief Complaint Chief Complaint  Patient presents with   Headache   Blurred Vision    HPI Jerome Barker is a 43 y.o. male.     The history is provided by the patient.  Neurologic Problem  This is a recurrent problem. The current episode started 1 to 2 hours ago. The problem occurs rarely. The problem has been resolved. Associated symptoms include headaches (mild). Pertinent negatives include no chest pain, no abdominal pain and no shortness of breath. Associated symptoms comments: Right blurry vision, right hand numbness, speech difficulty. Nothing aggravates the symptoms. Nothing relieves the symptoms. He has tried nothing for the symptoms. The treatment provided no relief.    Past Medical History:  Diagnosis Date   Asthma    Hay fever     Patient Active Problem List   Diagnosis Date Noted   Snoring 07/05/2017   PCP NOTES >>>>>>>>>>>>>>>>>>>>>>> 08/09/2015   Allergic rhinitis 08/11/2014   Eczema of both hands 08/11/2014   Pain in joint, shoulder region 11/30/2013   Annual physical exam 07/02/2013   Asthma     Past Surgical History:  Procedure Laterality Date   NO PAST SURGERIES          Home Medications    Prior to Admission medications   Medication Sig Start Date End Date Taking? Authorizing Provider  ibuprofen (ADVIL) 200 MG tablet Take 400 mg by mouth every 6 (six) hours as needed for mild pain.   Yes [provider]  aspirin EC 81 MG tablet Take 1 tablet (81 mg total) by mouth daily for 30 days. 10/02/18 11/01/18  Makaylah Oddo, DO  sildenafil (REVATIO) 20 MG tablet TAKE 3 TO 4 TABLETS BY MOUTH AT BEDTIME AS NEEDED Patient not taking: Reported on 10/02/2018 08/17/18   Colon Branch, MD    Family History Family History  Problem Relation Age of Onset   Diabetes Father    Colon cancer Neg Hx     Prostate cancer Neg Hx    CAD Neg Hx     Social History Social History   Tobacco Use   Smoking status: Former Smoker    Packs/day: 0.25    Types: Cigarettes    Last attempt to quit: 05/13/2015    Years since quitting: 3.3   Smokeless tobacco: Never Used   Tobacco comment: quit  Substance Use Topics   Alcohol use: Yes    Alcohol/week: 14.0 standard drinks    Types: 14 Cans of beer per week    Comment: 2 beers/day   Drug use: No    Frequency: 0.2 times per week    Comment: occ marijuana       Allergies   Prednisone   Review of Systems Review of Systems  Constitutional: Negative for chills and fever.  HENT: Negative for ear pain and sore throat.   Eyes: Negative for pain and visual disturbance.  Respiratory: Negative for cough and shortness of breath.   Cardiovascular: Negative for chest pain and palpitations.  Gastrointestinal: Negative for abdominal pain and vomiting.  Genitourinary: Negative for dysuria and hematuria.  Musculoskeletal: Negative for arthralgias and back pain.  Skin: Negative for color change and rash.  Neurological: Positive for speech difficulty, numbness and headaches (mild). Negative for dizziness, tremors, seizures, syncope, facial asymmetry, weakness and light-headedness.  All other systems reviewed and are negative.  Physical Exam Updated Vital Signs  ED Triage Vitals  Enc Vitals Group     BP 10/02/18 1050 130/85     Pulse Rate 10/02/18 1050 77     Resp 10/02/18 1050 20     Temp 10/02/18 1050 98.3 F (36.8 C)     Temp Source 10/02/18 1050 Oral     SpO2 10/02/18 1050 98 %     Weight 10/02/18 1046 240 lb (108.9 kg)     Height 10/02/18 1046 5\' 9"  (1.753 m)     Head Circumference --      Peak Flow --      Pain Score 10/02/18 1045 4     Pain Loc --      Pain Edu? --      Excl. in [place]? --     Physical Exam Vitals signs and nursing note reviewed.  Constitutional:      General: He is not in acute distress.    Appearance: He  is well-developed. He is not ill-appearing.  HENT:     Head: Normocephalic and atraumatic.     Mouth/Throat:     Mouth: Mucous membranes are moist.  Eyes:     General: No visual field deficit.    Extraocular Movements: Extraocular movements intact.     Conjunctiva/sclera: Conjunctivae normal.     Pupils: Pupils are equal, round, and reactive to light.  Neck:     Musculoskeletal: Normal range of motion and neck supple.  Cardiovascular:     Rate and Rhythm: Normal rate and regular rhythm.     Heart sounds: Normal heart sounds. No murmur.  Pulmonary:     Effort: Pulmonary effort is normal. No respiratory distress.     Breath sounds: Normal breath sounds.  Abdominal:     Palpations: Abdomen is soft.     Tenderness: There is no abdominal tenderness.  Musculoskeletal: Normal range of motion.  Skin:    General: Skin is warm and dry.     Capillary Refill: Capillary refill takes less than 2 seconds.  Neurological:     Mental Status: He is alert and oriented to person, place, and time. Mental status is at baseline.     Cranial Nerves: No cranial nerve deficit, dysarthria or facial asymmetry.     Sensory: No sensory deficit.     Motor: No weakness.     Gait: Gait normal.  Psychiatric:        Mood and Affect: Mood normal.      ED Treatments / Results  Labs (all labs ordered are listed, but only abnormal results are displayed) Labs Reviewed  BASIC METABOLIC PANEL - Abnormal; Notable for the following components:      Result Value   Glucose, Bld 102 (*)    All other components within normal limits  CBC WITH DIFFERENTIAL/PLATELET    EKG EKG Interpretation  Date/Time:  Friday Oct 02 2018 11:30:22 EDT Ventricular Rate:  68 PR Interval:    QRS Duration: 81 QT Interval:  370 QTC Calculation: 394 R Axis:   68 Text Interpretation:  Sinus rhythm Confirmed by Lennice Sites [phone]) on 10/02/2018 11:34:18 AM Also confirmed by Lennice Sites [phone]), editor Philomena Doheny [phone])  on  10/02/2018 12:27:34 PM   Radiology Ct Head Wo Contrast  Result Date: 10/02/2018 CLINICAL DATA:  Episode of blurry vision of the right eye and right hand numbness this morning. EXAM: CT HEAD WITHOUT CONTRAST TECHNIQUE: Contiguous axial images were obtained from the base of the skull through  the vertex without intravenous contrast. COMPARISON:  Head CT scan 01/15/2018. FINDINGS: Brain: No evidence of acute infarction, hemorrhage, hydrocephalus, extra-axial collection or mass lesion/mass effect. Vascular: No hyperdense vessel or unexpected calcification. Skull: Intact.  No focal lesion. Sinuses/Orbits: Negative. Other: Scalp lesion on the right seen on the prior examination appears to been resected. No new abnormality. IMPRESSION: Negative head CT. Electronically Signed   By: Inge Rise M.D.   On: 10/02/2018 12:03   Mr Brain Wo Contrast (neuro Protocol)  Result Date: 10/02/2018 CLINICAL DATA:  Blurred vision, headache, numbness in the right hand, and difficulty speaking. EXAM: MRI HEAD WITHOUT CONTRAST MRA HEAD WITHOUT CONTRAST TECHNIQUE: Multiplanar, multiecho pulse sequences of the brain and surrounding structures were obtained without intravenous contrast. Angiographic images of the head were obtained using MRA technique without contrast. COMPARISON:  Head CT 10/02/2018 FINDINGS: MRI HEAD FINDINGS Brain: There is no evidence of acute infarct, intracranial hemorrhage, mass, midline shift, or extra-axial fluid collection. The ventricles and sulci are normal. The brain is normal in signal. Vascular: Major intracranial vascular flow voids are preserved. Skull and upper cervical spine: Unremarkable bone marrow signal. Sinuses/Orbits: Unremarkable orbits. Minimal bilateral anterior ethmoid air cell mucosal thickening. Clear mastoid air cells. Other: Postoperative changes with suspected scarring in the right frontoparietal scalp near the vertex. MRA HEAD FINDINGS The visualized distal vertebral arteries are  patent to the basilar with the right being strongly dominant. PICA origins were not imaged. Patent AICA and SCA origins are identified bilaterally. The basilar artery is widely patent. Posterior communicating arteries are not clearly identified and may be diminutive or absent. PCAs are patent without evidence of significant stenosis. The internal carotid arteries are patent from skull base to carotid termini without evidence of significant stenosis. ACAs and MCAs are patent without evidence of proximal branch occlusion or significant stenosis. No aneurysm is identified. IMPRESSION: 1. Unremarkable appearance of the brain. 2. Negative head MRA. Electronically Signed   By: Logan Bores M.D.   On: 10/02/2018 14:30   Mr Virgel Paling (cerebral Arteries)  Result Date: 10/02/2018 CLINICAL DATA:  Blurred vision, headache, numbness in the right hand, and difficulty speaking. EXAM: MRI HEAD WITHOUT CONTRAST MRA HEAD WITHOUT CONTRAST TECHNIQUE: Multiplanar, multiecho pulse sequences of the brain and surrounding structures were obtained without intravenous contrast. Angiographic images of the head were obtained using MRA technique without contrast. COMPARISON:  Head CT 10/02/2018 FINDINGS: MRI HEAD FINDINGS Brain: There is no evidence of acute infarct, intracranial hemorrhage, mass, midline shift, or extra-axial fluid collection. The ventricles and sulci are normal. The brain is normal in signal. Vascular: Major intracranial vascular flow voids are preserved. Skull and upper cervical spine: Unremarkable bone marrow signal. Sinuses/Orbits: Unremarkable orbits. Minimal bilateral anterior ethmoid air cell mucosal thickening. Clear mastoid air cells. Other: Postoperative changes with suspected scarring in the right frontoparietal scalp near the vertex. MRA HEAD FINDINGS The visualized distal vertebral arteries are patent to the basilar with the right being strongly dominant. PICA origins were not imaged. Patent AICA and SCA origins  are identified bilaterally. The basilar artery is widely patent. Posterior communicating arteries are not clearly identified and may be diminutive or absent. PCAs are patent without evidence of significant stenosis. The internal carotid arteries are patent from skull base to carotid termini without evidence of significant stenosis. ACAs and MCAs are patent without evidence of proximal branch occlusion or significant stenosis. No aneurysm is identified. IMPRESSION: 1. Unremarkable appearance of the brain. 2. Negative head MRA. Electronically Signed  By: Logan Bores M.D.   On: 10/02/2018 14:30    Procedures Procedures (including critical care time)  Medications Ordered in ED Medications  sodium chloride 0.9 % bolus 1,000 mL (0 mLs Intravenous Stopped 10/02/18 1333)     Initial Impression / Assessment and Plan / ED Course  I have reviewed the triage vital signs and the nursing notes.  Pertinent labs & imaging results that were available during my care of the patient were reviewed by me and considered in my medical decision making (see chart for details).     Jerome Barker is a 43 year old male with history of asthma who presents to the ED with neurological problem.  Patient with normal vitals.  No fever.  Patient states that he woke up with a mild right-sided headache and had some blurry vision/bright lights in his right eye, right hand numbness, possible speech changes that resolved after 30 minutes.  He has a history of migraines.  Has had a history of similar symptoms in the past with possibly headaches as well.  Does not have any stroke risk factors.  ABCD2 score is 1.  Patient is asymptomatic now.  Normal neurological exam.  Talked with neurology and they recommend CT and MRI/MRA of the brain.  Overall atypical story for TIA.  More likely complex migraine.  Given his ABCD 2 score recommend likely daily aspirin if everything is negative and can follow-up outpatient with neurology for further  work-up given that this is likely more complex migraine.  Patient had unremarkable lab work.  CT of the head and of the brain were unremarkable.  Patient has no history of MS in the family.  Does not appear typical of MS.  Overall patient was asymptomatic throughout my care. EKG sinus. Can follow-up closely outpatient with neurology for further work-up if this was a TIA.  Recommend that he continue daily aspirin.  Discharged in good condition.  Given return precautions.  This chart was dictated using voice recognition software.  Despite best efforts to proofread,  errors can occur which can change the documentation meaning.   Final Clinical Impressions(s) / ED Diagnoses   Final diagnoses:  Other migraine without status migrainosus, not intractable  Numbness    ED Discharge Orders         Ordered    aspirin EC 81 MG tablet  Daily     10/02/18 1528           [place], DO 10/02/18 1531

## 2018-10-02 NOTE — Telephone Encounter (Signed)
Called pt and he is about to check in the ED now

## 2018-10-02 NOTE — ED Notes (Signed)
Please call Nyaire Denbleyker (wife) [phone] with an update

## 2018-10-02 NOTE — ED Notes (Signed)
Nurse navigator spoke with patient and helped patient retrieve his cell phone for himself to update family plan of care.

## 2018-10-02 NOTE — ED Triage Notes (Signed)
Pt c/o blurred vision on R side when he woke up this am (went to bed at 1130 last night, woke around 6 am), HA, numbness in R hand and fingers, and pt states he felt he couldn't get words out; pt took ibuprofen at home, which he says helped HA; denies blurred vision and numbness currently; denies weakness; has had history of same but was not evaluated

## 2018-10-02 NOTE — Telephone Encounter (Signed)
FYI. Pt headed to ED.  

## 2018-10-02 NOTE — Telephone Encounter (Signed)
Agree, needs to go to the ED now.  Needs to go by ambulance, will forward this to [facility]

## 2018-10-06 ENCOUNTER — Telehealth: Payer: Self-pay | Admitting: Internal Medicine

## 2018-10-06 NOTE — Telephone Encounter (Signed)
Spoke w/ Pt- informed that results were from ED when he was there on 10/02/2018, tried scheduling virtual ED f/u- he is currently at work and will have to call back to set up.

## 2018-10-06 NOTE — Telephone Encounter (Signed)
Copied from [place] [phone]. Topic: Quick Communication - See Telephone Encounter >> Oct 06, 2018 11:14 AM Robina Ade, Helene Kelp D wrote: CRM for notification. See Telephone encounter for: 10/06/18. Patient called and said that he got a message for new lab results and would like to talk to someone about them because he is not sure what they are about. Please call patient back, thanks.

## 2018-12-21 ENCOUNTER — Encounter: Payer: Self-pay | Admitting: Internal Medicine

## 2018-12-23 ENCOUNTER — Other Ambulatory Visit: Payer: Self-pay

## 2018-12-28 ENCOUNTER — Encounter: Payer: Self-pay | Admitting: Internal Medicine

## 2018-12-28 ENCOUNTER — Ambulatory Visit (INDEPENDENT_AMBULATORY_CARE_PROVIDER_SITE_OTHER): Payer: BC Managed Care – PPO | Admitting: Internal Medicine

## 2018-12-28 ENCOUNTER — Other Ambulatory Visit (HOSPITAL_COMMUNITY)
Admission: RE | Admit: 2018-12-28 | Discharge: 2018-12-28 | Disposition: A | Discharge status: Home / Self Care | Payer: BC Managed Care – PPO | Source: Ambulatory Visit | Attending: Internal Medicine | Admitting: Internal Medicine

## 2018-12-28 ENCOUNTER — Other Ambulatory Visit: Payer: Self-pay

## 2018-12-28 VITALS — BP 112/72 | HR 90 | Temp 98.5°F | Resp 16 | Ht 69.0 in | Wt 252.1 lb

## 2018-12-28 DIAGNOSIS — R300 Dysuria: Secondary | ICD-10-CM | POA: Diagnosis not present

## 2018-12-28 DIAGNOSIS — E010 Iodine-deficiency related diffuse (endemic) goiter: Secondary | ICD-10-CM | POA: Diagnosis not present

## 2018-12-28 LAB — URINALYSIS, ROUTINE W REFLEX MICROSCOPIC
Bilirubin Urine: NEGATIVE
Hgb urine dipstick: NEGATIVE
Ketones, ur: NEGATIVE
Leukocytes,Ua: NEGATIVE
Nitrite: NEGATIVE
RBC / HPF: NONE SEEN (ref 0–?)
Specific Gravity, Urine: >=1.03 — AB (ref 1.000–1.030)
Total Protein, Urine: NEGATIVE
Urine Glucose: NEGATIVE
Urobilinogen, UA: 0.2 (ref 0.0–1.0)
WBC, UA: NONE SEEN (ref 0–?)
pH: 5.5 (ref 5.0–8.0)

## 2018-12-28 NOTE — Progress Notes (Signed)
Subjective:    Patient ID: Jerome Barker, male    DOB: 20-Jun-1975, 43 y.o.   MRN: [id number]  DOS:  12/28/2018 Type of visit - description: Acute 6 weeks history of occasional "tightness" at the shaft of the penis, 1 or 2 episodes a week, lasts 30 minutes, not associated with erections. Additionally has noted some mild dysuria. Denies any rash at the GU area. Denies any high risk sexual behavior.   Review of Systems No fever chills No urinary urgency or frequency. No hematuria or testicular pain. No nausea, vomiting or abdominal discomfort  Past Medical History:  Diagnosis Date  . Asthma   . Hay fever     Past Surgical History:  Procedure Laterality Date  . NO PAST SURGERIES      Social History   Socioeconomic History  . Marital status: Married    Spouse name: Not on file  . Number of children: 2  . Years of education: Not on file  . Highest education level: Not on file  Occupational History  . Occupation: Training and development officer  Social Needs  . Financial resource strain: Not on file  . Food insecurity    Worry: Not on file    Inability: Not on file  . Transportation needs    Medical: Not on file    Non-medical: Not on file  Tobacco Use  . Smoking status: Former Smoker    Packs/day: 0.25    Types: Cigarettes    Quit date: 05/13/2015    Years since quitting: 3.6  . Smokeless tobacco: Never Used  . Tobacco comment: quit  Substance and Sexual Activity  . Alcohol use: Yes    Alcohol/week: 14.0 standard drinks    Types: 14 Cans of beer per week    Comment: 2 beers/day  . Drug use: No    Frequency: 0.2 times per week    Comment: occ marijuana    . Sexual activity: Yes  Lifestyle  . Physical activity    Days per week: Not on file    Minutes per session: Not on file  . Stress: Not on file  Relationships  . Social Herbalist on phone: Not on file    Gets together: Not on file    Attends religious service: Not on file    Active member of club or  organization: Not on file    Attends meetings of clubs or organizations: Not on file    Relationship status: Not on file  . Intimate partner violence    Fear of current or ex partner: Not on file    Emotionally abused: Not on file    Physically abused: Not on file    Forced sexual activity: Not on file  Other Topics Concern  . Not on file  Social History Narrative   12 grade education   Lives w/ wife and daughter    Daughter 2007    Son 2001      Allergies as of 12/28/2018      Reactions   Prednisone Other (See Comments)   Flu-like symptoms (any other steroids)      Medication List       Accurate as of December 28, 2018 10:51 AM. If you have any questions, ask your nurse or doctor.        ibuprofen 200 MG tablet Commonly known as: ADVIL Take 400 mg by mouth every 6 (six) hours as needed for mild pain.   sildenafil 20 MG tablet Commonly  known as: REVATIO TAKE 3 TO 4 TABLETS BY MOUTH AT BEDTIME AS NEEDED           Objective:   Physical Exam BP 112/72 (BP Location: Left Arm, Patient Position: Sitting, Cuff Size: Normal)   Pulse 90   Temp 98.5 F (36.9 C) (Oral)   Resp 16   Ht 5\' 9"  (1.753 m)   Wt 252 lb 2 oz (114.4 kg)   SpO2 96%   BMI 37.23 kg/m  General:   Well developed, NAD, BMI noted.  HEENT:  Normocephalic . Face symmetric, atraumatic Neck: Right side of the thyroid is slightly enlarged but not tender or nodular Abdomen:  Not distended, soft, non-tender. No rebound or rigidity. GU: Few small lymph nodes at both groins Normal testicles, nontender. Penis: No lesions, swelling, discharge or other abnormalities Skin: Not pale. Not jaundice Neurologic:  alert & oriented X3.  Speech normal, gait appropriate for age and unassisted Psych--  Cognition and judgment appear intact.  Cooperative with normal attention span and concentration.  Behavior appropriate. No anxious or depressed appearing.     Assessment      Assessment Hyperglycemia: A1c 6.  05/29/2018  Asthma Allergies Goiter-- per [place] 06-2013 ,dominant R nodule. BX (-) 06-2013; failed to f/u on [place] Rx 2017  PLAN: Dysuria, penile discomfort: Physical exam is essentially normal.  We will get a UA, urine culture, gonorrhea and Chlamydia testing although he is low risk for STDs.  He has not been sexually active lately (wife has a medical condition). Further advised with results. Thyromegaly: We again had a long discussion about the issue, I recommend a follow-up ultrasound, patient reluctantly accepted.  If based on the ultrasound he is recommended a rebiopsy, we agreed that he will be referred to Endo first. CPX due , schedule at his convenience

## 2018-12-28 NOTE — Progress Notes (Signed)
Pre visit review using our clinic review tool, if applicable. No additional management support is needed unless otherwise documented below in the visit note. 

## 2018-12-28 NOTE — Patient Instructions (Signed)
GO TO THE LAB : Provide a urine sample  GO TO THE FRONT DESK Schedule your next appointment   for a physical exam at your [place]: Schedule a ultrasound of your thyroid

## 2018-12-29 LAB — URINE CYTOLOGY ANCILLARY ONLY
Chlamydia: NEGATIVE
Neisseria Gonorrhea: NEGATIVE
Trichomonas: NEGATIVE

## 2018-12-29 NOTE — Assessment & Plan Note (Signed)
Dysuria, penile discomfort: Physical exam is essentially normal.  We will get a UA, urine culture, gonorrhea and Chlamydia testing although he is low risk for STDs.  He has not been sexually active lately (wife has a medical condition). Further advised with results. Thyromegaly: We again had a long discussion about the issue, I recommend a follow-up ultrasound, patient reluctantly accepted.  If based on the ultrasound he is recommended a rebiopsy, we agreed that he will be referred to Endo first. CPX due , schedule at his convenience

## 2018-12-30 LAB — URINE CULTURE
MICRO NUMBER:: 730159
Result:: NO GROWTH
SPECIMEN QUALITY:: ADEQUATE

## 2019-01-04 ENCOUNTER — Ambulatory Visit (HOSPITAL_BASED_OUTPATIENT_CLINIC_OR_DEPARTMENT_OTHER): Payer: BC Managed Care – PPO

## 2019-04-06 ENCOUNTER — Other Ambulatory Visit: Payer: Self-pay | Admitting: Internal Medicine

## 2019-04-06 MED ORDER — SILDENAFIL CITRATE 20 MG PO TABS: ORAL_TABLET | ORAL | 3 refills | Status: DC

## 2019-04-06 NOTE — Telephone Encounter (Signed)
Copied from [place] [phone]. Topic: Quick Communication - Rx Refill/Question >> Apr 06, 2019  9:28 AM Leward Quan A wrote: Medication: sildenafil (REVATIO) 20 MG tablet   Has the patient contacted their pharmacy? Yes.   (Agent: If no, request that the patient contact the pharmacy for the refill.) (Agent: If yes, when and what did the pharmacy advise?)  Preferred Pharmacy (with phone number or street name): [facility] ([place]), [place] - [address] [phone] (Phone) [phone] (Fax)    Agent: Please be advised that RX refills may take up to 3 business days. We ask that you follow-up with your pharmacy.

## 2019-05-10 ENCOUNTER — Other Ambulatory Visit: Payer: Self-pay

## 2019-05-11 ENCOUNTER — Encounter: Payer: Self-pay | Admitting: Internal Medicine

## 2019-05-11 ENCOUNTER — Ambulatory Visit (INDEPENDENT_AMBULATORY_CARE_PROVIDER_SITE_OTHER): Payer: BC Managed Care – PPO | Admitting: Internal Medicine

## 2019-05-11 VITALS — BP 110/78 | HR 87 | Temp 96.3°F | Ht 69.0 in | Wt 257.4 lb

## 2019-05-11 DIAGNOSIS — E010 Iodine-deficiency related diffuse (endemic) goiter: Secondary | ICD-10-CM

## 2019-05-11 DIAGNOSIS — Z23 Encounter for immunization: Secondary | ICD-10-CM | POA: Diagnosis not present

## 2019-05-11 DIAGNOSIS — M25562 Pain in left knee: Secondary | ICD-10-CM | POA: Diagnosis not present

## 2019-05-11 LAB — COMPREHENSIVE METABOLIC PANEL
ALT: 19 U/L (ref 0–53)
AST: 18 U/L (ref 0–37)
Albumin: 4 g/dL (ref 3.5–5.2)
Alkaline Phosphatase: 55 U/L (ref 39–117)
BUN: 16 mg/dL (ref 6–23)
CO2: 30 mEq/L (ref 19–32)
Calcium: 9 mg/dL (ref 8.4–10.5)
Chloride: 104 mEq/L (ref 96–112)
Creatinine, Ser: 1.05 mg/dL (ref 0.40–1.50)
GFR: 92.89 mL/min (ref >60.00)
Glucose, Bld: 91 mg/dL (ref 70–99)
Potassium: 4.2 mEq/L (ref 3.5–5.1)
Sodium: 139 mEq/L (ref 135–145)
Total Bilirubin: 0.4 mg/dL (ref 0.2–1.2)
Total Protein: 6.8 g/dL (ref 6.0–8.3)

## 2019-05-11 LAB — CBC
HCT: 43.2 % (ref 39.0–52.0)
Hemoglobin: 14.5 g/dL (ref 13.0–17.0)
MCHC: 33.7 g/dL (ref 30.0–36.0)
MCV: 90.5 fl (ref 78.0–100.0)
Platelets: 205 10*3/uL (ref 150.0–400.0)
RBC: 4.77 Mil/uL (ref 4.22–5.81)
RDW: 13.7 % (ref 11.5–15.5)
WBC: 7.8 10*3/uL (ref 4.0–10.5)

## 2019-05-11 LAB — URIC ACID: Uric Acid, Serum: 6.3 mg/dL (ref 4.0–7.8)

## 2019-05-11 NOTE — Assessment & Plan Note (Signed)
Knee pain, swelling and pain: Suspect internal derangement.  Given swelling, gout is possible but unlikely, will get a CMP, CBC and uric acid. Refer to Ortho Treat pain with ice, knee sleeve, ibuprofen and Tylenol.  GI precautions discussed, see AVS. Thyromegaly: Did not pursue ultrasound, encouraged to do.

## 2019-05-11 NOTE — Progress Notes (Signed)
Subjective:    Patient ID: Jerome Barker, male    DOB: 01-28-1976, 43 y.o.   MRN: [id number]  DOS:  05/11/2019 Type of visit - description: Acute 4 weeks history of pain at the L knee: back >> front  The knee is not visible swollen  but it does feel that way. Feels tight whenever he flexes his knee. Denies any injury. No locking or buckling of the left knee Has not taken any medication.   Review of Systems No fever chills No chest pain no difficulty breathing No palpitations No calf pain per se No prolonged car trips or airplane trips  Past Medical History:  Diagnosis Date  . Asthma   . Hay fever     Past Surgical History:  Procedure Laterality Date  . NO PAST SURGERIES      Social History   Socioeconomic History  . Marital status: Married    Spouse name: Not on file  . Number of children: 2  . Years of education: Not on file  . Highest education level: Not on file  Occupational History  . Occupation: cook  Tobacco Use  . Smoking status: Former Smoker    Packs/day: 0.25    Types: Cigarettes    Quit date: 05/13/2015    Years since quitting: 3.9  . Smokeless tobacco: Never Used  . Tobacco comment: quit  Substance and Sexual Activity  . Alcohol use: Yes    Alcohol/week: 14.0 standard drinks    Types: 14 Cans of beer per week    Comment: 2 beers/day  . Drug use: No    Frequency: 0.2 times per week    Comment: occ marijuana    . Sexual activity: Yes  Other Topics Concern  . Not on file  Social History Narrative   12 grade education   Lives w/ wife and daughter    Daughter 2007    Son 2001   Social Determinants of Health   Financial Resource Strain:   . Difficulty of Paying Living Expenses: Not on file  Food Insecurity:   . Worried About Charity fundraiser in the Last Year: Not on file  . Ran Out of Food in the Last Year: Not on file  Transportation Needs:   . Lack of Transportation (Medical): Not on file  . Lack of Transportation  (Non-Medical): Not on file  Physical Activity:   . Days of Exercise per Week: Not on file  . Minutes of Exercise per Session: Not on file  Stress:   . Feeling of Stress : Not on file  Social Connections:   . Frequency of Communication with Friends and Family: Not on file  . Frequency of Social Gatherings with Friends and Family: Not on file  . Attends Religious Services: Not on file  . Active Member of Clubs or Organizations: Not on file  . Attends Archivist Meetings: Not on file  . Marital Status: Not on file  Intimate Partner Violence:   . Fear of Current or Ex-Partner: Not on file  . Emotionally Abused: Not on file  . Physically Abused: Not on file  . Sexually Abused: Not on file      Allergies as of 05/11/2019      Reactions   Prednisone Other (See Comments)   Flu-like symptoms (any other steroids)      Medication List       Accurate as of May 11, 2019 12:27 PM. If you have any questions, ask your  nurse or doctor.        ibuprofen 200 MG tablet Commonly known as: ADVIL Take 400 mg by mouth every 6 (six) hours as needed for mild pain.   sildenafil 20 MG tablet Commonly known as: REVATIO TAKE 3 TO 4 TABLETS BY MOUTH AT BEDTIME AS NEEDED           Objective:   Physical Exam BP 110/78 (BP Location: Left Arm, Patient Position: Sitting, Cuff Size: Large)   Pulse 87   Temp (!) 96.3 F (35.7 C) (Temporal)   Ht 5\' 9"  (1.753 m)   Wt 257 lb 6 oz (116.7 kg)   SpO2 97%   BMI 38.01 kg/m  General:   Well developed, NAD, BMI noted. HEENT:  Normocephalic . Face symmetric, atraumatic MSK: Right knee normal Left knee: Mildly swollen, + small effusion upon palpation, patella moves freely, no warm or red. + Pain with hyper flexion. Calves: Symmetric and no TTP Skin: Not pale. Not jaundice Neurologic:  alert & oriented X3.  Speech normal, gait somewhat limited by the knee pain Psych--  Cognition and judgment appear intact.  Cooperative with  normal attention span and concentration.  Behavior appropriate. No anxious or depressed appearing.      Assessment     Assessment Hyperglycemia: A1c 6. 05/29/2018  Asthma Allergies Goiter-- per [place] 06-2013 ,dominant R nodule. BX (-) 06-2013; failed to f/u on [place] Rx 2017  PLAN: Knee pain, swelling and pain: Suspect internal derangement.  Given swelling, gout is possible but unlikely, will get a CMP, CBC and uric acid. Refer to Ortho Treat pain with ice, knee sleeve, ibuprofen and Tylenol.  GI precautions discussed, see AVS. Thyromegaly: Did not pursue ultrasound, encouraged to do.   This visit occurred during the SARS-CoV-2 public health emergency.  Safety protocols were in place, including screening questions prior to the visit, additional usage of staff PPE, and extensive cleaning of exam room while observing appropriate contact time as indicated for disinfecting solutions.

## 2019-05-11 NOTE — Patient Instructions (Signed)
GO TO THE LAB : Get the blood work      We are referring you to the orthopedic doctor  You can alternate ibuprofen and Tylenol for pain, see instructions below  IBUPROFEN (Advil or Motrin) 200 mg 2 tablets every 8 hours as needed for pain.  Always take it with food because may cause gastritis and ulcers.  If you notice nausea, stomach pain, change in the color of stools --->  Stop the medicine and let us know   Tylenol  500 mg OTC 2 tabs a day every 8 hours as needed for pain   Use knee sleeve  Ice the knee twice a day  If you have fever, chills, you get much worse: Call immediately

## 2019-09-13 ENCOUNTER — Other Ambulatory Visit: Payer: Self-pay

## 2019-09-13 ENCOUNTER — Ambulatory Visit: Payer: Self-pay | Attending: Internal Medicine

## 2019-09-13 DIAGNOSIS — Z23 Encounter for immunization: Secondary | ICD-10-CM

## 2019-09-13 NOTE — Progress Notes (Signed)
   Covid-19 Vaccination [facility]  Name:  Jerome Barker    MRN: [id number] DOB: 09/06/1975  09/13/2019  Mr. Jerome Barker was observed post Covid-19 immunization for 15 minutes without incident. He was provided with Vaccine Information Sheet and instruction to access the V-Safe system.   Mr. Jerome Barker was instructed to call 911 with any severe reactions post vaccine: Marland Kitchen Difficulty breathing  . Swelling of face and throat  . A fast heartbeat  . A bad rash all over body  . Dizziness and weakness   Immunizations Administered    Name Date Dose VIS Date Route   Pfizer COVID-19 Vaccine 09/13/2019 10:57 AM 0.3 mL 07/21/2018 Intramuscular   Manufacturer: [employer], [employer]   Lot: [id number]   [place]: [id number]

## 2019-10-05 ENCOUNTER — Ambulatory Visit: Payer: Self-pay

## 2020-01-25 ENCOUNTER — Ambulatory Visit (INDEPENDENT_AMBULATORY_CARE_PROVIDER_SITE_OTHER): Payer: BC Managed Care – PPO | Admitting: Medical

## 2020-01-25 ENCOUNTER — Other Ambulatory Visit: Payer: Self-pay

## 2020-01-25 VITALS — BP 123/76 | HR 91 | Resp 18 | Ht 69.0 in | Wt 265.0 lb

## 2020-01-25 DIAGNOSIS — M79672 Pain in left foot: Secondary | ICD-10-CM

## 2020-01-25 MED ORDER — SILDENAFIL CITRATE 20 MG PO TABS: ORAL_TABLET | ORAL | 3 refills | Status: DC

## 2020-01-25 MED ORDER — IBUPROFEN 600 MG PO TABS: 600.0000 mg | ORAL_TABLET | Freq: Three times a day (TID) | ORAL | 0 refills | Status: DC | PRN

## 2020-01-25 NOTE — Patient Instructions (Addendum)
Pt has moderate to severe pain in left foot most of time pressing pedal. Will get xray of foot today.   Will rx ibuprofen tablets for pain. Continue to use Dr. Lala Lund heel pads.  Will get xray then will likely refer you to podiatrist for further evaluation and treatment.  Work note asking to give relief from machine operation/pressing pedal until fully evaluated.  Follow up in 3 weeks or as needed   Pt on 02-02-2020 states foot pain resolved after rest and not operating machine/dead man pedal. Filled out paperwork for him.

## 2020-01-25 NOTE — Progress Notes (Addendum)
Subjective:    Patient ID: Jerome Barker, male    DOB: Mar 10, 1976, 44 y.o.   MRN: [id number]  HPI   Pt in for some left foot pain. This pain has been going on for about a month. He notes pain mostly when he is pushing down on plate called a dead man pedal. He states has to put pressure to keep the pedal down. 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 Pt states if not working pain is noticeable but a lot less noticeable. He states his shoe is steel toe and shoe has cushio. He also uses Dr. Bobbye Morton heel pad.  Pt states first day he missed work was September. Pt states for preceding month was having pain working pressing on dead man pedal.   Pt states if he takes ibuprofen it will help with pain.  Review of Systems  Constitutional: Negative for chills, fatigue and fever.  Respiratory: Negative for cough, chest tightness, shortness of breath and wheezing.   Cardiovascular: Negative for chest pain and palpitations.  Musculoskeletal:       Left foot pain.  Skin: Negative for rash.  Hematological: Negative for adenopathy. Does not bruise/bleed easily.  Psychiatric/Behavioral: Negative for behavioral problems.    Past Medical History:  Diagnosis Date  . Asthma   . Hay fever      Social History   Socioeconomic History  . Marital status: Married    Spouse name: Not on file  . Number of children: 2  . Years of education: Not on file  . Highest education level: Not on file  Occupational History  . Occupation: cook  Tobacco Use  . Smoking status: Former Smoker    Packs/day: 0.25    Types: Cigarettes    Quit date: 05/13/2015    Years since quitting: 4.7  . Smokeless tobacco: Never Used  . Tobacco comment: quit  Substance and Sexual Activity  . Alcohol use: Yes    Alcohol/week: 14.0 standard drinks    Types: 14 Cans of beer per week    Comment: 2 beers/day  . Drug use: No    Frequency: 0.2 times per week    Comment: occ marijuana    . Sexual activity: Yes  Other Topics Concern    . Not on file  Social History Narrative   12 grade education   Lives w/ wife and daughter    Daughter 2007    Son 2001   Social Determinants of Health   Financial Resource Strain:   . Difficulty of Paying Living Expenses: Not on file  Food Insecurity:   . Worried About Charity fundraiser in the Last Year: Not on file  . Ran Out of Food in the Last Year: Not on file  Transportation Needs:   . Lack of Transportation (Medical): Not on file  . Lack of Transportation (Non-Medical): Not on file  Physical Activity:   . Days of Exercise per Week: Not on file  . Minutes of Exercise per Session: Not on file  Stress:   . Feeling of Stress : Not on file  Social Connections:   . Frequency of Communication with Friends and Family: Not on file  . Frequency of Social Gatherings with Friends and Family: Not on file  . Attends Religious Services: Not on file  . Active Member of Clubs or Organizations: Not on file  . Attends Archivist Meetings: Not on file  . Marital Status: Not on file  Intimate Partner Violence:   .  Fear of Current or Ex-Partner: Not on file  . Emotionally Abused: Not on file  . Physically Abused: Not on file  . Sexually Abused: Not on file    Past Surgical History:  Procedure Laterality Date  . NO PAST SURGERIES      Family History  Problem Relation Age of Onset  . Diabetes Father   . Colon cancer Neg Hx   . Prostate cancer Neg Hx   . CAD Neg Hx     Allergies  Allergen Reactions  . Prednisone Other (See Comments)    Flu-like symptoms (any other steroids)    Current Outpatient Medications on File Prior to Visit  Medication Sig Dispense Refill  . ibuprofen (ADVIL) 200 MG tablet Take 400 mg by mouth every 6 (six) hours as needed for mild pain.    . sildenafil (REVATIO) 20 MG tablet TAKE 3 TO 4 TABLETS BY MOUTH AT BEDTIME AS NEEDED 30 tablet 3   No current facility-administered medications on file prior to visit.    BP 123/76 (BP Location:  Left Arm, Patient Position: Sitting, Cuff Size: Large)   Pulse 91   Resp 18   Ht 5\' 9"  (1.753 m)   Wt 265 lb (120.2 kg)   SpO2 98%   BMI 39.13 kg/m       Objective:   Physical Exam  /General- No acute distress. Pleasant patient. Neck- Full range of motion, no jvd Lungs- Clear, even and unlabored. Heart- regular rate and rhythm. Neurologic- CNII- XII grossly intact.  Left foot- bottom of foot tenderness to palpation.        Assessment & Plan:  Pt has moderate to severe pain in left foot most of time pressing pedal. Will get xray of foot today.   Will rx ibuprofen tablets for pain. Continue to use Dr. Lala Lund heel pads.  Will get xray then will likely refer you to podiatrist for further evaluation and treatment.  Work note asking to give relief from machine operation/pressing pedal until fully evaluated.  Follow up in 3 weeks or as needed  Time spent with patient today was 25  minutes which consisted of chart revdiew, discussing diagnosis, work up treatment and documentation.  Mackie Pai, PA-C

## 2020-02-01 ENCOUNTER — Ambulatory Visit (HOSPITAL_BASED_OUTPATIENT_CLINIC_OR_DEPARTMENT_OTHER)
Admission: RE | Admit: 2020-02-01 | Discharge: 2020-02-01 | Disposition: A | Discharge status: Home / Self Care | Payer: BC Managed Care – PPO | Source: Ambulatory Visit | Attending: Medical | Admitting: Medical

## 2020-02-01 ENCOUNTER — Other Ambulatory Visit: Payer: Self-pay

## 2020-02-01 ENCOUNTER — Telehealth: Payer: Self-pay | Admitting: Internal Medicine

## 2020-02-01 DIAGNOSIS — M79672 Pain in left foot: Secondary | ICD-10-CM

## 2020-02-01 NOTE — Telephone Encounter (Signed)
Pt dropped off Disability & leave forms.  PCP is Larose Kells but he saw Architect.   I put in [place] folder  He would like it emailed back to him as soon as we can get it too him

## 2020-02-02 ENCOUNTER — Telehealth: Payer: Self-pay | Admitting: Medical

## 2020-02-02 DIAGNOSIS — M79672 Pain in left foot: Secondary | ICD-10-CM

## 2020-02-02 NOTE — Telephone Encounter (Signed)
If you would cancel the referral presently. I talked to pt and he states he got better completely. If his pain returns will put in new referral.

## 2020-02-02 NOTE — Telephone Encounter (Signed)
I sent it to [facility] in [place] as urgent. They should be able to get him in but I'll keep an eye on it.

## 2020-02-02 NOTE — Telephone Encounter (Signed)
Paperwork placed on your desk. Please fax on 02-03-20. Keep confirmation fax. Also please scheduled him follow up in 2 weeks.

## 2020-02-02 NOTE — Telephone Encounter (Signed)
I got pt form. I think best to try to refer to podiatrist quickly. Would ask that he take the same form to podiatrist appointment and would defer to specialist to fill out paper work. I think this is most appropiate way to approach this as form mention disabilty pay. So prefer specialist evaluate him.

## 2020-02-02 NOTE — Telephone Encounter (Signed)
Spoke with patient and he stated the form is due by Friday and podiatry has not called him yet.

## 2020-02-02 NOTE — Telephone Encounter (Signed)
Patient called again asking about paperwork , patient states the sooner the paper is finished he can go back to work.

## 2020-02-02 NOTE — Telephone Encounter (Signed)
Referral to podiatrist placed. Can we can him in this week?

## 2020-02-03 NOTE — Telephone Encounter (Signed)
[facility] spoke to pt this morning 9:30am and he scheduled appt 9/20.

## 2020-02-03 NOTE — Telephone Encounter (Signed)
Form faxed and form placed up front

## 2020-02-03 NOTE — Telephone Encounter (Signed)
Ok if he chose to go that's fine.

## 2020-02-07 ENCOUNTER — Ambulatory Visit: Payer: BC Managed Care – PPO | Admitting: Medical

## 2020-02-14 ENCOUNTER — Ambulatory Visit: Payer: BC Managed Care – PPO | Admitting: Podiatry

## 2020-02-29 ENCOUNTER — Other Ambulatory Visit: Payer: Self-pay

## 2020-02-29 ENCOUNTER — Ambulatory Visit (INDEPENDENT_AMBULATORY_CARE_PROVIDER_SITE_OTHER): Payer: BC Managed Care – PPO | Admitting: Internal Medicine

## 2020-02-29 ENCOUNTER — Encounter: Payer: Self-pay | Admitting: Internal Medicine

## 2020-02-29 VITALS — BP 117/80 | HR 94 | Temp 98.4°F | Resp 16 | Ht 69.0 in | Wt 261.4 lb

## 2020-02-29 DIAGNOSIS — E049 Nontoxic goiter, unspecified: Secondary | ICD-10-CM | POA: Diagnosis not present

## 2020-02-29 DIAGNOSIS — Z114 Encounter for screening for human immunodeficiency virus [HIV]: Secondary | ICD-10-CM

## 2020-02-29 DIAGNOSIS — Z1159 Encounter for screening for other viral diseases: Secondary | ICD-10-CM

## 2020-02-29 DIAGNOSIS — Z23 Encounter for immunization: Secondary | ICD-10-CM

## 2020-02-29 DIAGNOSIS — Z0000 Encounter for general adult medical examination without abnormal findings: Secondary | ICD-10-CM

## 2020-02-29 NOTE — Patient Instructions (Signed)
You lost few pounds, continue your better diet, try to exercise regularly  Is very likely that you will need a Covid vaccine booster.  Please check frequently the CDC instructions.   GO TO THE LAB : Get the blood work     [name], Jerome Barker Come back for a physical exam in 1 year

## 2020-02-29 NOTE — Progress Notes (Signed)
   Subjective:    Patient ID: Jerome Barker, male    DOB: 11/17/1975, 44 y.o.   MRN: [id number]  DOS:  02/29/2020 Type of visit - description: CPX No concerns except he would like his prostate check  Wt Readings from Last 3 Encounters:  02/29/20 261 lb 6 oz (118.6 kg)  01/25/20 265 lb (120.2 kg)  05/11/19 257 lb 6 oz (116.7 kg)     Review of Systems  A 14 point review of systems is negative    Past Medical History:  Diagnosis Date  . Asthma   . Hay fever     Past Surgical History:  Procedure Laterality Date  . NO PAST SURGERIES      Allergies as of 02/29/2020      Reactions   Prednisone Other (See Comments)   Flu-like symptoms (any other steroids)      Medication List       Accurate as of February 29, 2020 11:59 PM. If you have any questions, ask your nurse or doctor.        ibuprofen 600 MG tablet Commonly known as: ADVIL Take 1 tablet (600 mg total) by mouth every 8 (eight) hours as needed.   sildenafil 20 MG tablet Commonly known as: REVATIO TAKE 3 TO 4 TABLETS BY MOUTH AT BEDTIME AS NEEDED          Objective:   Physical Exam BP 117/80 (BP Location: Left Arm, Patient Position: Sitting, Cuff Size: Normal)   Pulse 94   Temp 98.4 F (36.9 C) (Oral)   Resp 16   Ht 5\' 9"  (1.753 m)   Wt 261 lb 6 oz (118.6 kg)   SpO2 96%   BMI 38.60 kg/m  General: Well developed, NAD, BMI noted Neck: + Thyromegaly noted, not nodular or tender. HEENT:  Normocephalic . Face symmetric, atraumatic Lungs:  CTA B Normal respiratory effort, no intercostal retractions, no accessory muscle use. Heart: RRR,  no murmur.  Abdomen:  Not distended, soft, non-tender. No rebound or rigidity.   Lower extremities: no pretibial edema bilaterally DRE: Normal sphincter tone, exam limited by BMI, no stools, prostate seems normal Skin: Exposed areas without rash. Not pale. Not jaundice Neurologic:  alert & oriented X3.  Speech normal, gait appropriate for age and  unassisted Strength symmetric and appropriate for age.  Psych: Cognition and judgment appear intact.  Cooperative with normal attention span and concentration.  Behavior appropriate. No anxious or depressed appearing.     Assessment     Assessment Hyperglycemia: A1c 6. 05/29/2018  Asthma Allergies Goiter-- per [place] 06-2013 ,dominant R nodule. BX (-) 06-2013; failed to f/u on [place] Rx 2017  PLAN: Here for CPX Hyperglycemia: Check A1c Goiter: Again recommend ultrasound, will try to reschedule Obesity: He has obesity, hyperglycemia, might benefit from GLP-1, if he does not continue to lose weight he will let me know RTC 1 year.  This visit occurred during the SARS-CoV-2 public health emergency.  Safety protocols were in place, including screening questions prior to the visit, additional usage of staff PPE, and extensive cleaning of exam room while observing appropriate contact time as indicated for disinfecting solutions.

## 2020-02-29 NOTE — Progress Notes (Signed)
Pre visit review using our clinic review tool, if applicable. No additional management support is needed unless otherwise documented below in the visit note. 

## 2020-03-01 ENCOUNTER — Telehealth: Payer: Self-pay | Admitting: Internal Medicine

## 2020-03-01 ENCOUNTER — Encounter: Payer: Self-pay | Admitting: Internal Medicine

## 2020-03-01 LAB — LIPID PANEL
Cholesterol: 166 mg/dL (ref <200)
HDL: 48 mg/dL (ref >=40)
LDL Cholesterol (Calc): 95 mg/dL (calc)
Non-HDL Cholesterol (Calc): 118 mg/dL (calc) (ref <130)
Total CHOL/HDL Ratio: 3.5 (calc) (ref <5.0)
Triglycerides: 125 mg/dL (ref <150)

## 2020-03-01 LAB — BASIC METABOLIC PANEL
BUN: 14 mg/dL (ref 7–25)
CO2: 24 mmol/L (ref 20–32)
Calcium: 9.5 mg/dL (ref 8.6–10.3)
Chloride: 103 mmol/L (ref 98–110)
Creat: 0.97 mg/dL (ref 0.60–1.35)
Glucose, Bld: 85 mg/dL (ref 65–99)
Potassium: 4.4 mmol/L (ref 3.5–5.3)
Sodium: 139 mmol/L (ref 135–146)

## 2020-03-01 LAB — PSA: PSA: 0.57 ng/mL (ref <=4.0)

## 2020-03-01 LAB — HEMOGLOBIN A1C
Hgb A1c MFr Bld: 6.1 % of total Hgb — ABNORMAL HIGH (ref <5.7)
Mean Plasma Glucose: 128 (calc)
eAG (mmol/L): 7.1 (calc)

## 2020-03-01 LAB — HIV ANTIBODY (ROUTINE TESTING W REFLEX): HIV 1&2 Ab, 4th Generation: NONREACTIVE

## 2020-03-01 LAB — HEPATITIS C ANTIBODY
Hepatitis C Ab: NONREACTIVE
SIGNAL TO CUT-OFF: 0.02 (ref <1.00)

## 2020-03-01 LAB — TSH: TSH: 1.33 mIU/L (ref 0.40–4.50)

## 2020-03-01 NOTE — Assessment & Plan Note (Signed)
-  Td 2011 -Covid vaccine, x2, last May 2021.  Based on BMI, he likely qualify for booster.  Recommend to stay tuned with CDC guidelines. -Flu shot today -Request prostate cancer screening, he is African-American, DRE negative, check a PSA.  No symptoms -diet and exercise: Discussed, he has lost few pounds, encouraged to continue with a healthier diet -Labs: BMP, FLP, A1c, TSH,PSA, hep C, HIV

## 2020-03-01 NOTE — Assessment & Plan Note (Signed)
Here for CPX Hyperglycemia: Check A1c Goiter: Again recommend ultrasound, will try to reschedule Obesity: He has obesity, hyperglycemia, might benefit from GLP-1, if he does not continue to lose weight he will let me know RTC 1 year.

## 2020-03-01 NOTE — Telephone Encounter (Signed)
Results pending.

## 2020-03-01 NOTE — Telephone Encounter (Signed)
Requesting a call back once lab results are in.

## 2020-03-06 ENCOUNTER — Telehealth: Payer: Self-pay | Admitting: Medical

## 2020-03-06 ENCOUNTER — Ambulatory Visit (HOSPITAL_BASED_OUTPATIENT_CLINIC_OR_DEPARTMENT_OTHER): Admission: RE | Admit: 2020-03-06 | Payer: BC Managed Care – PPO | Source: Ambulatory Visit

## 2020-03-06 ENCOUNTER — Telehealth: Payer: Self-pay | Admitting: Internal Medicine

## 2020-03-06 NOTE — Telephone Encounter (Signed)
Patient states the forms from [place] are the disability part from his original forms on 9/8 .Marland Kitchen just needs info to match forms from 9/8 , no new issue , you still want to see for a visit ?

## 2020-03-06 NOTE — Telephone Encounter (Signed)
On 02-02-20 I fill out fmla paperwork from [place] and placed it on your desk. Your note 02-03-20 state faxed formed and place up front. Do we have copies? Did he pick up what you placed up front?  I have blank form [name] received on 02-01-20. This looks like duplicate blank form.  Don't want to duplicate effort. We should have copies of paperwork. Can you ask pt why we are getting blank form.

## 2020-03-06 NOTE — Telephone Encounter (Signed)
Pt came in a dropped of Jerome Barker work forms for [place] to fill out. Pt would like to be called when forms are filled out & completed([phone])  But into [place] bin up front

## 2020-03-06 NOTE — Telephone Encounter (Signed)
He needs to make an appointment.  I filled out paperwork and then a week later there was the exact identical paperwork that I already sent in.  I really do not see any difference between those 2 forms.  It also has been more than a month since I had seen him.  He needs to have an office visit and double check with his employer with additional paperwork he needs.  Simply filling out the exact same form the same does not make sense.  If he can be scheduled for later this week or early next week.

## 2020-03-06 NOTE — Telephone Encounter (Signed)
I already filled out one form.Put him back to work per his request. So if he needs other form filled out may need in office visit. Depends. Need to review that paperwork

## 2020-03-06 NOTE — Telephone Encounter (Signed)
Pt seen [name] for this. Forwarding to [facility].

## 2020-03-06 NOTE — Telephone Encounter (Signed)
Please refer to other telephone note 

## 2020-03-07 NOTE — Telephone Encounter (Signed)
Called pt but unable to lvm  

## 2020-03-07 NOTE — Telephone Encounter (Signed)
Appt made for monday

## 2020-03-13 ENCOUNTER — Ambulatory Visit: Payer: BC Managed Care – PPO | Admitting: Medical

## 2020-08-16 ENCOUNTER — Encounter: Payer: Self-pay | Admitting: Internal Medicine

## 2020-11-06 ENCOUNTER — Other Ambulatory Visit: Payer: Self-pay | Admitting: Internal Medicine

## 2020-12-26 ENCOUNTER — Encounter: Payer: Self-pay | Admitting: Internal Medicine

## 2020-12-26 ENCOUNTER — Other Ambulatory Visit: Payer: Self-pay

## 2020-12-26 ENCOUNTER — Ambulatory Visit: Payer: BC Managed Care – PPO | Admitting: Internal Medicine

## 2020-12-26 VITALS — BP 122/80 | HR 95 | Temp 98.0°F | Resp 16 | Ht 69.0 in | Wt 249.0 lb

## 2020-12-26 DIAGNOSIS — R079 Chest pain, unspecified: Secondary | ICD-10-CM

## 2020-12-26 LAB — CBC WITH DIFFERENTIAL/PLATELET
Basophils Absolute: 0.1 10*3/uL (ref 0.0–0.1)
Basophils Relative: 0.8 % (ref 0.0–3.0)
Eosinophils Absolute: 0.2 10*3/uL (ref 0.0–0.7)
Eosinophils Relative: 2.9 % (ref 0.0–5.0)
HCT: 44.2 % (ref 39.0–52.0)
Hemoglobin: 15 g/dL (ref 13.0–17.0)
Lymphocytes Relative: 23.8 % (ref 12.0–46.0)
Lymphs Abs: 1.6 10*3/uL (ref 0.7–4.0)
MCHC: 34.1 g/dL (ref 30.0–36.0)
MCV: 89.7 fl (ref 78.0–100.0)
Monocytes Absolute: 0.6 10*3/uL (ref 0.1–1.0)
Monocytes Relative: 9.2 % (ref 3.0–12.0)
Neutro Abs: 4.3 10*3/uL (ref 1.4–7.7)
Neutrophils Relative %: 63.3 % (ref 43.0–77.0)
Platelets: 193 10*3/uL (ref 150.0–400.0)
RBC: 4.93 Mil/uL (ref 4.22–5.81)
RDW: 12.8 % (ref 11.5–15.5)
WBC: 6.8 10*3/uL (ref 4.0–10.5)

## 2020-12-26 LAB — BASIC METABOLIC PANEL
BUN: 12 mg/dL (ref 6–23)
CO2: 28 mEq/L (ref 19–32)
Calcium: 9 mg/dL (ref 8.4–10.5)
Chloride: 102 mEq/L (ref 96–112)
Creatinine, Ser: 0.95 mg/dL (ref 0.40–1.50)
GFR: 96.65 mL/min (ref >60.00)
Glucose, Bld: 111 mg/dL — ABNORMAL HIGH (ref 70–99)
Potassium: 3.9 mEq/L (ref 3.5–5.1)
Sodium: 137 mEq/L (ref 135–145)

## 2020-12-26 NOTE — Progress Notes (Signed)
Subjective:    Patient ID: Jerome Barker, male    DOB: 1976-01-31, 45 y.o.   MRN: [id number]  DOS:  12/26/2020 Type of visit - description: Acute Symptoms started 2 weeks ago. Reports burning or pressure type of feeling at the left anterior chest, some they mid chest. He had approximately 4 episodes, the first one lasted few minutes, the other episodes 1 to 2 minutes. Typically happen at work when he is active loading and doing some lifting. Pain does not seem to be related to moving his torso No radiation.  Is somewhat reminds he to asthma when he was a child however he denies shortness of breath, cough, or wheezing.  Also denies heartburn, dysphagia or odynophagia. No shortness of breath, no palpitations. No lower extremity edema or calf pain.  Review of Systems See above   Past Medical History:  Diagnosis Date   Asthma    Hay fever     Past Surgical History:  Procedure Laterality Date   NO PAST SURGERIES      Allergies as of 12/26/2020       Reactions   Prednisone Other (See Comments)   Flu-like symptoms (any other steroids)        Medication List        Accurate as of December 26, 2020 10:24 AM. If you have any questions, ask your nurse or doctor.          ibuprofen 600 MG tablet Commonly known as: ADVIL Take 1 tablet (600 mg total) by mouth every 8 (eight) hours as needed.   sildenafil 20 MG tablet Commonly known as: REVATIO TAKE 3 TO 4 TABLETS BY MOUTH AT BEDTIME AS NEEDED           Objective:   Physical Exam BP 122/80 (BP Location: Left Arm, Patient Position: Sitting, Cuff Size: Normal)   Pulse 95   Temp 98 F (36.7 C) (Oral)   Resp 16   Ht '5\' 9"'$  (1.753 m)   Wt 249 lb (112.9 kg)   SpO2 96%   BMI 36.77 kg/m  General:   Well developed, NAD, BMI noted.  HEENT:  Normocephalic . Face symmetric, atraumatic Lungs:  CTA B Normal respiratory effort, no intercostal retractions, no accessory muscle use. Heart: RRR,  no murmur. Chest wall:  No TTP anteriorly Abdomen:  Not distended, soft, non-tender. No rebound or rigidity.   Skin: Not pale. Not jaundice Lower extremities: no pretibial edema bilaterally  Neurologic:  alert & oriented X3.  Speech normal, gait appropriate for age and unassisted Psych--  Cognition and judgment appear intact.  Cooperative with normal attention span and concentration.  Behavior appropriate. No anxious or depressed appearing.     Assessment    Assessment Hyperglycemia: A1c 6. 05/29/2018  Asthma Allergies Goiter-- per [place] 06-2013 ,dominant R nodule. BX (-) 06-2013; failed to f/u on [place] Rx 2017  PLAN: Chest pain: As described above, started 2 weeks ago, mostly at work when she is active. CV RFs: He is 45, last A1c 6.1 on diet control, last LDL 95 on no medicines, no family history. EKG today: NSR Symptoms have typical and atypical features.  It is reasonable to proceed with further eval Plan: CBC, BMP, chest x-ray, Lexiscan stress test. ER if symptoms severe Start aspirin today, consider statins Reassess in 3 months.     This visit occurred during the SARS-CoV-2 public health emergency.  Safety protocols were in place, including screening questions prior to the visit, additional usage of  staff PPE, and extensive cleaning of exam room while observing appropriate contact time as indicated for disinfecting solutions.

## 2020-12-26 NOTE — Patient Instructions (Addendum)
Start aspirin 81 mg every day with your breakfast  ER if symptoms severe.   GO TO THE LAB : Get the blood work     [place], [place] Come back for a checkup in 3 months  STOP BY THE FIRST FLOOR:  get the XR

## 2020-12-27 NOTE — Assessment & Plan Note (Signed)
Chest pain: As described above, started 2 weeks ago, mostly at work when she is active. CV RFs: He is 45, last A1c 6.1 on diet control, last LDL 95 on no medicines, no family history. EKG today: NSR Symptoms have typical and atypical features.  It is reasonable to proceed with further eval Plan: CBC, BMP, chest x-ray, Lexiscan stress test. ER if symptoms severe Start aspirin today, consider statins Reassess in 3 months.

## 2021-01-02 ENCOUNTER — Telehealth (HOSPITAL_COMMUNITY): Payer: Self-pay | Admitting: *Deleted

## 2021-01-02 NOTE — Telephone Encounter (Signed)
Patient given detailed instructions per Myocardial Perfusion Study Information Sheet for the test on 8/16/22Patient notified to arrive 15 minutes early and that it is imperative to arrive on time for appointment to keep from having the test rescheduled.  If you need to cancel or reschedule your appointment, please call the office within 24 hours of your appointment. . Patient verbalized understanding. Kirstie Peri, RN

## 2021-01-09 ENCOUNTER — Encounter (HOSPITAL_COMMUNITY): Payer: BC Managed Care – PPO

## 2021-01-09 ENCOUNTER — Telehealth (HOSPITAL_COMMUNITY): Payer: Self-pay | Admitting: Internal Medicine

## 2021-01-09 NOTE — Telephone Encounter (Signed)
Patient called and cancelled Myoview the day of the appointment. He states he had an emergency. He did not wish to reschedule at this time and will call us back when ready to reschedule. Order will be removed from the [facility] and when patient calls back we can reinstate the orders. Thank you.

## 2021-01-09 NOTE — Telephone Encounter (Signed)
Letter mailed

## 2021-01-09 NOTE — Telephone Encounter (Signed)
Noted, thank you for the update.  Kaylyn, please send him a letter advising him that I recommend to proceed with a stress test, be happy to talk to him if he has questions.

## 2021-10-15 ENCOUNTER — Encounter: Payer: Self-pay | Admitting: Internal Medicine

## 2021-11-20 ENCOUNTER — Encounter: Payer: Self-pay | Admitting: Gastroenterology

## 2022-01-08 ENCOUNTER — Other Ambulatory Visit: Payer: Self-pay | Admitting: Internal Medicine

## 2022-01-14 ENCOUNTER — Other Ambulatory Visit: Payer: Self-pay | Admitting: Internal Medicine

## 2022-01-23 ENCOUNTER — Telehealth: Payer: Self-pay

## 2022-01-23 MED ORDER — SILDENAFIL CITRATE 20 MG PO TABS: ORAL_TABLET | ORAL | 0 refills | Status: DC

## 2022-01-23 NOTE — Telephone Encounter (Signed)
30 tablets sent only, overdue for appt. A letter was mailed to Pt on 10/15/21 making him aware he is overdue for visit.

## 2022-01-23 NOTE — Telephone Encounter (Signed)
Nurse Assessment Nurse: Nyoka Cowden, RN, Tanika Date/Time (Eastern Time): 01/22/2022 9:11:33 PM Confirm and document reason for call. If symptomatic, describe symptoms. ---Caller states needs a refill on Rx sildenafil, (taking for viagra) Pt is completely out of med. states that he has not had med filled with a pharmacy recently so he will have to f/u with office in am. caller v/u Does the patient have any new or worsening symptoms? ---No Disp. Time Eilene Ghazi Time) Disposition Final User 01/22/2022 9:01:03 PM Attempt made - no message left Nyoka Cowden RN, Ludger Nutting 01/22/2022 9:14:32 PM Clinical Call Yes Nyoka Cowden RN, Ludger Nutting Final Disposition 01/22/2022 9:14:32 PM Clinical Call Yes Nyoka Cowden, RN, Ludger Nutting

## 2022-02-12 ENCOUNTER — Ambulatory Visit (AMBULATORY_SURGERY_CENTER): Payer: BC Managed Care – PPO | Admitting: *Deleted

## 2022-02-12 VITALS — Ht 69.0 in | Wt 229.2 lb

## 2022-02-12 DIAGNOSIS — Z1211 Encounter for screening for malignant neoplasm of colon: Secondary | ICD-10-CM

## 2022-02-12 MED ORDER — PEG 3350-KCL-NA BICARB-NACL 420 G PO SOLR: 4000.0000 mL | Freq: Once | ORAL | 0 refills | Status: AC

## 2022-02-12 NOTE — Progress Notes (Signed)
No egg or soy allergy known to patient  No issues known to pt with past sedation with any surgeries or procedures Patient denies ever being told they had issues or difficulty with intubation  No FH of Malignant Hyperthermia Pt is not on diet pills Pt is not on  home 02  Pt is not on blood thinners  Pt denies issues with constipation  No A fib or A flutter Have any cardiac testing pending--no Pt instructed to use [email or web address] or GoodRx for a price reduction on prep   

## 2022-02-18 ENCOUNTER — Encounter: Payer: BC Managed Care – PPO | Admitting: Gastroenterology

## 2022-02-19 ENCOUNTER — Encounter: Payer: Self-pay | Admitting: Gastroenterology

## 2022-03-05 ENCOUNTER — Ambulatory Visit (AMBULATORY_SURGERY_CENTER): Payer: BC Managed Care – PPO | Admitting: Gastroenterology

## 2022-03-05 ENCOUNTER — Encounter: Payer: Self-pay | Admitting: Gastroenterology

## 2022-03-05 VITALS — BP 123/83 | HR 95 | Temp 98.4°F | Resp 17 | Ht 69.0 in | Wt 229.0 lb

## 2022-03-05 DIAGNOSIS — Z1211 Encounter for screening for malignant neoplasm of colon: Secondary | ICD-10-CM

## 2022-03-05 DIAGNOSIS — D124 Benign neoplasm of descending colon: Secondary | ICD-10-CM

## 2022-03-05 MED ORDER — SODIUM CHLORIDE 0.9 % IV SOLN: 500.0000 mL | Freq: Once | INTRAVENOUS | Status: DC

## 2022-03-05 NOTE — Progress Notes (Signed)
To pacu, VSS. Report to Rn.tb 

## 2022-03-05 NOTE — Progress Notes (Signed)
History & Physical  Primary Care Physician:  Colon Branch, MD Primary Gastroenterologist: Lucio Edward, MD  CHIEF COMPLAINT:  CRC screening   HPI: Jerome Barker is a 46 y.o. male for CRC screening, average risk, with colonoscopy.   Past Medical History:  Diagnosis Date   Allergy    seasonal   Asthma    Hay fever     Past Surgical History:  Procedure Laterality Date   CYST EXCISION     from head updated 02/12/22   NO PAST SURGERIES      Prior to Admission medications   Medication Sig Start Date End Date Taking? Authorizing Provider  aspirin EC 81 MG tablet Take 81 mg by mouth daily. Swallow whole. Patient not taking: Reported on 02/12/2022    [provider]  fexofenadine-pseudoephedrine (ALLEGRA-D ALLERGY & CONGESTION) 60-120 MG 12 hr tablet 1 tab(s) orally    [provider]  ibuprofen (ADVIL) 600 MG tablet Take 1 tablet (600 mg total) by mouth every 8 (eight) hours as needed. Patient not taking: Reported on 12/26/2020 01/25/20   Saguier, [name], PA-C  sildenafil (REVATIO) 20 MG tablet Take 3-4 tablets by mouth qhs prn 01/23/22   Colon Branch, MD    Current Outpatient Medications  Medication Sig Dispense Refill   aspirin EC 81 MG tablet Take 81 mg by mouth daily. Swallow whole. (Patient not taking: Reported on 02/12/2022)     fexofenadine-pseudoephedrine (ALLEGRA-D ALLERGY & CONGESTION) 60-120 MG 12 hr tablet 1 tab(s) orally     ibuprofen (ADVIL) 600 MG tablet Take 1 tablet (600 mg total) by mouth every 8 (eight) hours as needed. (Patient not taking: Reported on 12/26/2020) 30 tablet 0   sildenafil (REVATIO) 20 MG tablet Take 3-4 tablets by mouth qhs prn 30 tablet 0   Current Facility-Administered Medications  Medication Dose Route Frequency Provider Last Rate Last Admin   0.9 %  sodium chloride infusion  500 mL Intravenous Once Ladene Artist, MD        Allergies as of 03/05/2022 - Review Complete 02/12/2022  Allergen Reaction Noted   Prednisone  Other (See Comments) 05/17/2013    Family History  Problem Relation Age of Onset   Diabetes Father    Colon cancer Neg Hx    Prostate cancer Neg Hx    CAD Neg Hx    Colon polyps Neg Hx    Crohn's disease Neg Hx    Esophageal cancer Neg Hx    Rectal cancer Neg Hx    Stomach cancer Neg Hx    Ulcerative colitis Neg Hx     Social History   Socioeconomic History   Marital status: Married    Spouse name: Not on file   Number of children: 2   Years of education: Not on file   Highest education level: Not on file  Occupational History   Occupation: cook  Tobacco Use   Smoking status: Former    Packs/day: 0.25    Types: Cigarettes    Quit date: 05/13/2015    Years since quitting: 6.8    Passive exposure: Current (at work sometimes)   Smokeless tobacco: Never   Tobacco comments:    quit  Vaping Use   Vaping Use: Never used  Substance and Sexual Activity   Alcohol use: Yes    Comment: 2 beers weekend    Drug use: No    Frequency: 0.2 times per week    Comment: occ marijuana  Sexual activity: Yes  Other Topics Concern   Not on file  Social History Narrative   12 grade education   Lives w/ wife and daughter    Daughter 2007    Son 2001   Social Determinants of Radio broadcast assistant Strain: Not on [employer] Insecurity: Not on file  Transportation Needs: Not on file  Physical Activity: Not on file  Stress: Not on file  Social Connections: Not on file  Intimate Partner Violence: Not on file    Review of Systems:  All systems reviewed were negative except where noted in HPI.   Physical Exam: General:  Alert, well-developed, in NAD Head:  Normocephalic and atraumatic. Eyes:  Sclera clear, no icterus.   Conjunctiva pink. Ears:  Normal auditory acuity. Mouth:  No deformity or lesions.  Neck:  Supple; no masses . Lungs:  Clear throughout to auscultation.   No wheezes, crackles, or rhonchi. No acute distress. Heart:  Regular rate and rhythm; no  murmurs. Abdomen:  Soft, nondistended, nontender. No masses, hepatomegaly. No obvious masses.  Normal bowel .    Rectal:  Deferred   Msk:  Symmetrical without gross deformities.. Pulses:  Normal pulses noted. Extremities:  Without edema. Neurologic:  Alert and  oriented x4;  grossly normal neurologically. Skin:  Intact without significant lesions or rashes. Cervical Nodes:  No significant cervical adenopathy. Psych:  Alert and cooperative. Normal mood and affect.  Impression / Plan:   CRC screening, average risk, for colonoscopy.  Pricilla Riffle. Fuller Plan  03/05/2022, 3:05 PM See Shea Evans, [facility], to contact our on call provider

## 2022-03-05 NOTE — Op Note (Signed)
[facility] Patient Name: Jerome Barker Procedure Date: 03/05/2022 3:21 PM MRN: [id number] Endoscopist: Ladene Artist , MD Age: 46 Referring MD:  Date of Birth: 06/13/1975 Gender: Male Account #: [id number] Procedure:                Colonoscopy Indications:              Screening for colorectal malignant neoplasm Medicines:                Monitored Anesthesia Care Procedure:                Pre-Anesthesia Assessment:                           - Prior to the procedure, a History and Physical                            was performed, and patient medications and                            allergies were reviewed. The patient's tolerance of                            previous anesthesia was also reviewed. The risks                            and benefits of the procedure and the sedation                            options and risks were discussed with the patient.                            All questions were answered, and informed consent                            was obtained. Prior Anticoagulants: The patient has                            taken no previous anticoagulant or antiplatelet                            agents. ASA Grade Assessment: II - A patient with                            mild systemic disease. After reviewing the risks                            and benefits, the patient was deemed in                            satisfactory condition to undergo the procedure.                           After obtaining informed consent, the colonoscope  was passed under direct vision. Throughout the                            procedure, the patient's blood pressure, pulse, and                            oxygen saturations were monitored continuously. The                            CF [id number] #[id number] was introduced through the anus                            and advanced to the the cecum, identified by                            appendiceal orifice  and ileocecal valve. The                            ileocecal valve, appendiceal orifice, and rectum                            were photographed. The quality of the bowel                            preparation was good. The colonoscopy was performed                            without difficulty. The patient tolerated the                            procedure well. Scope In: 3:23:28 PM Scope Out: 3:33:57 PM Scope Withdrawal Time: 0 hours 9 minutes 6 seconds  Total Procedure Duration: 0 hours 10 minutes 29 seconds  Findings:                 The perianal and digital rectal examinations were                            normal.                           A 6 mm polyp was found in the descending colon. The                            polyp was sessile. The polyp was removed with a                            cold snare. Resection and retrieval were complete.                           Non-bleeding internal hemorrhoids were found during                            retroflexion. The hemorrhoids were small and Grade  I (internal hemorrhoids that do not prolapse).                           The exam was otherwise without abnormality on                            direct and retroflexion views. Complications:            No immediate complications. Estimated blood loss:                            None. Estimated Blood Loss:     Estimated blood loss: none. Impression:               - One 6 mm polyp in the descending colon, removed                            with a cold snare. Resected and retrieved.                           - Non-bleeding internal hemorrhoids.                           - The examination was otherwise normal on direct                            and retroflexion views. Recommendation:           - Repeat colonoscopy after studies are complete for                            surveillance based on pathology results.                           - Patient has a contact  number available for                            emergencies. The signs and symptoms of potential                            delayed complications were discussed with the                            patient. Return to normal activities tomorrow.                            Written discharge instructions were provided to the                            patient.                           - Resume previous diet.                           - Continue present medications.                           -  Await pathology results. Ladene Artist, MD 03/05/2022 3:36:22 PM This report has been signed electronically.

## 2022-03-05 NOTE — Progress Notes (Signed)
Pt's states no medical or surgical changes since previsit or office visit. 

## 2022-03-05 NOTE — Patient Instructions (Signed)
HANDOUTS PROVIDED ON: POLYPS & HEMORRHOIDS  The polyp removed today have been sent for pathology.  The results can take 1-3 weeks to receive.  When your next colonoscopy should occur will be based on the pathology results.    You may resume your previous diet and medication schedule.  Thank you for allowing us to care for you today!!!   YOU HAD AN ENDOSCOPIC PROCEDURE TODAY AT THE [facility]:   Refer to the procedure report that was given to you for any specific questions about what was found during the examination.  If the procedure report does not answer your questions, please call your gastroenterologist to clarify.  If you requested that your care partner not be given the details of your procedure findings, then the procedure report has been included in a sealed envelope for you to review at your convenience later.  YOU SHOULD EXPECT: Some feelings of bloating in the abdomen. Passage of more gas than usual.  Walking can help get rid of the air that was put into your GI tract during the procedure and reduce the bloating. If you had a lower endoscopy (such as a colonoscopy or flexible sigmoidoscopy) you may notice spotting of blood in your stool or on the toilet paper. If you underwent a bowel prep for your procedure, you may not have a normal bowel movement for a few days.  Please Note:  You might notice some irritation and congestion in your nose or some drainage.  This is from the oxygen used during your procedure.  There is no need for concern and it should clear up in a day or so.  SYMPTOMS TO REPORT IMMEDIATELY:  Following lower endoscopy (colonoscopy or flexible sigmoidoscopy):  Excessive amounts of blood in the stool  Significant tenderness or worsening of abdominal pains  Swelling of the abdomen that is new, acute  Fever of 100F or higher  For urgent or emergent issues, a gastroenterologist can be reached at any hour by calling [phone]. Do not use MyChart  messaging for urgent concerns.    DIET:  We do recommend a small meal at first, but then you may proceed to your regular diet.  Drink plenty of fluids but you should avoid alcoholic beverages for 24 hours.  ACTIVITY:  You should plan to take it easy for the rest of today and you should NOT DRIVE or use heavy machinery until tomorrow (because of the sedation medicines used during the test).    FOLLOW UP: Our staff will call the number listed on your records the next business day following your procedure.  We will call around 7:15- 8:00 am to check on you and address any questions or concerns that you may have regarding the information given to you following your procedure. If we do not reach you, we will leave a message.     If any biopsies were taken you will be contacted by phone or by letter within the next 1-3 weeks.  Please call us at [phone] if you have not heard about the biopsies in 3 weeks.    SIGNATURES/CONFIDENTIALITY: You and/or your care partner have signed paperwork which will be entered into your electronic medical record.  These signatures attest to the fact that that the information above on your After Visit Summary has been reviewed and is understood.  Full responsibility of the confidentiality of this discharge information lies with you and/or your care-partner.  

## 2022-03-05 NOTE — Progress Notes (Signed)
Called to room to assist during endoscopic procedure.  Patient ID and intended procedure confirmed with present staff. Received instructions for my participation in the procedure from the performing physician.  

## 2022-03-06 ENCOUNTER — Telehealth: Payer: Self-pay

## 2022-03-06 NOTE — Telephone Encounter (Signed)
Follow up call placed, voice mailbox is full and RN unable to leave a message. SChaplin, RN,BSN

## 2022-03-20 ENCOUNTER — Encounter: Payer: Self-pay | Admitting: Gastroenterology

## 2022-06-17 ENCOUNTER — Encounter: Payer: Self-pay | Admitting: Internal Medicine

## 2022-06-17 ENCOUNTER — Other Ambulatory Visit (HOSPITAL_COMMUNITY)
Admission: RE | Admit: 2022-06-17 | Discharge: 2022-06-17 | Disposition: A | Discharge status: Home / Self Care | Payer: BC Managed Care – PPO | Source: Ambulatory Visit

## 2022-06-17 ENCOUNTER — Ambulatory Visit (HOSPITAL_BASED_OUTPATIENT_CLINIC_OR_DEPARTMENT_OTHER)
Admission: RE | Admit: 2022-06-17 | Discharge: 2022-06-17 | Disposition: A | Discharge status: Home / Self Care | Payer: BC Managed Care – PPO | Source: Ambulatory Visit | Attending: Internal Medicine | Admitting: Internal Medicine

## 2022-06-17 ENCOUNTER — Ambulatory Visit (INDEPENDENT_AMBULATORY_CARE_PROVIDER_SITE_OTHER): Payer: BC Managed Care – PPO | Admitting: Internal Medicine

## 2022-06-17 VITALS — BP 116/78 | HR 107 | Temp 97.8°F | Resp 16 | Ht 69.0 in | Wt 239.4 lb

## 2022-06-17 DIAGNOSIS — R1031 Right lower quadrant pain: Secondary | ICD-10-CM | POA: Insufficient documentation

## 2022-06-17 DIAGNOSIS — R1030 Lower abdominal pain, unspecified: Secondary | ICD-10-CM

## 2022-06-17 DIAGNOSIS — R300 Dysuria: Secondary | ICD-10-CM | POA: Diagnosis not present

## 2022-06-17 DIAGNOSIS — R1032 Left lower quadrant pain: Secondary | ICD-10-CM | POA: Diagnosis not present

## 2022-06-17 DIAGNOSIS — N5082 Scrotal pain: Secondary | ICD-10-CM | POA: Insufficient documentation

## 2022-06-17 DIAGNOSIS — N342 Other urethritis: Secondary | ICD-10-CM

## 2022-06-17 DIAGNOSIS — N50812 Left testicular pain: Secondary | ICD-10-CM | POA: Diagnosis not present

## 2022-06-17 DIAGNOSIS — Z113 Encounter for screening for infections with a predominantly sexual mode of transmission: Secondary | ICD-10-CM | POA: Diagnosis not present

## 2022-06-17 DIAGNOSIS — N50811 Right testicular pain: Secondary | ICD-10-CM | POA: Diagnosis not present

## 2022-06-17 MED ORDER — SILDENAFIL CITRATE 20 MG PO TABS: ORAL_TABLET | ORAL | 3 refills | Status: DC

## 2022-06-17 NOTE — Patient Instructions (Addendum)
Please schedule a physical exam at your earliest convenience.      GO TO THE LAB : Get the blood work      STOP BY THE FIRST FLOOR: Schedule ultrasound of your testicles.

## 2022-06-17 NOTE — Telephone Encounter (Signed)
Error

## 2022-06-17 NOTE — Progress Notes (Signed)
   Subjective:    Patient ID: Jerome Barker, male    DOB: 11-Dec-1975, 47 y.o.   MRN: [id number]  DOS:  06/17/2022 Type of visit - description: Acute, here with his wife  Symptoms started several months ago: Has scrotal discomfort for described as pain at the left, right or both testicles on and off. On self palpation, denies any lump, they are not particularly tender.  I ask about discharge and he said after urination sometimes he sees clear material coming out from the penis.  Admits to occasional dysuria, denies gross hematuria, fever or chills. No nausea vomiting No difficulty urinating but questionable increased frequency. Questionable suprapubic discomfort.   Review of Systems See above   Past Medical History:  Diagnosis Date   Allergy    seasonal   Asthma    Hay fever     Past Surgical History:  Procedure Laterality Date   CYST EXCISION     from head updated 02/12/22   NO PAST SURGERIES      Current Outpatient Medications  Medication Instructions   aspirin EC 81 mg, Daily   fexofenadine-pseudoephedrine (ALLEGRA-D ALLERGY & CONGESTION) 60-120 MG 12 hr tablet 1 tab(s) orally   ibuprofen (ADVIL) 600 mg, Oral, Every 8 hours PRN   sildenafil (REVATIO) 20 MG tablet Take 3-4 tablets by mouth qhs prn       Objective:   Physical Exam BP 116/78   Pulse (!) 107   Temp 97.8 F (36.6 C) (Oral)   Resp 16   Ht '5\' 9"'$  (1.753 m)   Wt 239 lb 6 oz (108.6 kg)   SpO2 98%   BMI 35.35 kg/m  General:   Well developed, NAD, BMI noted.  HEENT:  Normocephalic . Face symmetric, atraumatic Lungs:  CTA B Normal respiratory effort, no intercostal retractions, no accessory muscle use. Heart: RRR,  no murmur.  Abdomen:  Not distended, soft, non-tender. No rebound or rigidity. GU: Penis: No rash, no blisters, no obvious discharge. Scrotal contents: Normal DRE: Normal sphincter tone, no stools, prostate mildly enlarged? Skin: Not pale. Not jaundice Lower extremities: no  pretibial edema bilaterally  Neurologic:  alert & oriented X3.  Speech normal, gait appropriate for age and unassisted Psych--  Cognition and judgment appear intact.  Cooperative with normal attention span and concentration.  Behavior appropriate. No anxious or depressed appearing.     Assessment    Assessment Hyperglycemia: A1c 6. 05/29/2018  Asthma Allergies Goiter-- per [place] 06-2013 ,dominant R nodule. BX (-) 06-2013; failed multiple attempts for [place] f/u.  (no-show, pt cancellations)    PLAN: Testicular pain, questionable urethritis: Symptoms as described above, exam is benign, I obtained a  urethral swab however sample was limited, patient was very uncomfortable. Otherwise exam is benign. Plan: UA, urine culture, BMP, CBC, gonorrhea, chlamydia, trichomonas, HIV, RPR, PSA, scrotal ultrasound. Further advised with results. ED Requested a refill on sildenafil, done Also encouraged to schedule a CPX at his earliest convenience.

## 2022-06-18 LAB — HIV ANTIBODY (ROUTINE TESTING W REFLEX): HIV 1&2 Ab, 4th Generation: NONREACTIVE

## 2022-06-18 LAB — CBC WITH DIFFERENTIAL/PLATELET
Basophils Absolute: 0.1 10*3/uL (ref 0.0–0.1)
Basophils Relative: 1.3 % (ref 0.0–3.0)
Eosinophils Absolute: 0.2 10*3/uL (ref 0.0–0.7)
Eosinophils Relative: 3.2 % (ref 0.0–5.0)
HCT: 46.1 % (ref 39.0–52.0)
Hemoglobin: 15.7 g/dL (ref 13.0–17.0)
Lymphocytes Relative: 24.3 % (ref 12.0–46.0)
Lymphs Abs: 1.6 10*3/uL (ref 0.7–4.0)
MCHC: 34.2 g/dL (ref 30.0–36.0)
MCV: 90.6 fl (ref 78.0–100.0)
Monocytes Absolute: 0.8 10*3/uL (ref 0.1–1.0)
Monocytes Relative: 12.2 % — ABNORMAL HIGH (ref 3.0–12.0)
Neutro Abs: 4 10*3/uL (ref 1.4–7.7)
Neutrophils Relative %: 59 % (ref 43.0–77.0)
Platelets: 202 10*3/uL (ref 150.0–400.0)
RBC: 5.09 Mil/uL (ref 4.22–5.81)
RDW: 13.1 % (ref 11.5–15.5)
WBC: 6.8 10*3/uL (ref 4.0–10.5)

## 2022-06-18 LAB — URINE CULTURE
MICRO NUMBER:: 14455024
Result:: NO GROWTH
SPECIMEN QUALITY:: ADEQUATE

## 2022-06-18 LAB — URINALYSIS, ROUTINE W REFLEX MICROSCOPIC
Bilirubin Urine: NEGATIVE
Hgb urine dipstick: NEGATIVE
Ketones, ur: NEGATIVE
Leukocytes,Ua: NEGATIVE
Nitrite: NEGATIVE
RBC / HPF: NONE SEEN (ref 0–?)
Specific Gravity, Urine: 1.015 (ref 1.000–1.030)
Total Protein, Urine: NEGATIVE
Urine Glucose: NEGATIVE
Urobilinogen, UA: 1 (ref 0.0–1.0)
WBC, UA: NONE SEEN (ref 0–?)
pH: 7 (ref 5.0–8.0)

## 2022-06-18 LAB — BASIC METABOLIC PANEL
BUN: 16 mg/dL (ref 6–23)
CO2: 29 mEq/L (ref 19–32)
Calcium: 9.2 mg/dL (ref 8.4–10.5)
Chloride: 99 mEq/L (ref 96–112)
Creatinine, Ser: 1.15 mg/dL (ref 0.40–1.50)
GFR: 76.06 mL/min (ref >60.00)
Glucose, Bld: 98 mg/dL (ref 70–99)
Potassium: 4.6 mEq/L (ref 3.5–5.1)
Sodium: 136 mEq/L (ref 135–145)

## 2022-06-18 LAB — PSA: PSA: 0.77 ng/mL (ref 0.10–4.00)

## 2022-06-18 LAB — RPR: RPR Ser Ql: NONREACTIVE

## 2022-06-18 NOTE — Assessment & Plan Note (Signed)
Testicular pain, questionable urethritis: Symptoms as described above, exam is benign, I obtained a  urethral swab however sample was limited, patient was very uncomfortable. Otherwise exam is benign. Plan: UA, urine culture, BMP, CBC, gonorrhea, chlamydia, trichomonas, HIV, RPR, PSA, scrotal ultrasound. Further advised with results. ED Requested a refill on sildenafil, done Also encouraged to schedule a CPX at his earliest convenience.

## 2022-06-19 LAB — CYTOLOGY, (ORAL, ANAL, URETHRAL) ANCILLARY ONLY
Chlamydia: NEGATIVE
Comment: NEGATIVE
Comment: NEGATIVE
Comment: NORMAL
Neisseria Gonorrhea: NEGATIVE
Trichomonas: NEGATIVE

## 2022-06-30 ENCOUNTER — Other Ambulatory Visit: Payer: Self-pay | Admitting: Internal Medicine

## 2022-07-01 ENCOUNTER — Telehealth: Payer: Self-pay

## 2022-07-01 NOTE — Telephone Encounter (Signed)
PA initiated via Covermymeds; KEY: [id number]. Awaiting determination.

## 2022-07-08 NOTE — Telephone Encounter (Signed)
Called [employer] to inquire about PA, informed that it is still in process. No time frame for determination given.

## 2022-07-08 NOTE — Telephone Encounter (Signed)
Pt called to follow up on status of authorization for his sildenafil. Please call pt to advise.

## 2022-07-08 NOTE — Telephone Encounter (Signed)
Still pending.

## 2022-07-08 NOTE — Telephone Encounter (Signed)
PA denied. Plan exclusion   According to the Plan's Summary Program Description (SPD), drugs used to manage sexual dysfunction are excluded. Please refer to page 33 of the SPD for additional information and complete list of coverage exclusions.

## 2022-08-13 ENCOUNTER — Encounter: Payer: Self-pay | Admitting: Internal Medicine

## 2022-08-13 ENCOUNTER — Ambulatory Visit (INDEPENDENT_AMBULATORY_CARE_PROVIDER_SITE_OTHER): Payer: BC Managed Care – PPO | Admitting: Internal Medicine

## 2022-08-13 ENCOUNTER — Ambulatory Visit (HOSPITAL_BASED_OUTPATIENT_CLINIC_OR_DEPARTMENT_OTHER)
Admission: RE | Admit: 2022-08-13 | Discharge: 2022-08-13 | Disposition: A | Discharge status: Home / Self Care | Payer: BC Managed Care – PPO | Source: Ambulatory Visit | Attending: Internal Medicine | Admitting: Internal Medicine

## 2022-08-13 VITALS — BP 122/86 | HR 104 | Temp 97.8°F | Resp 18 | Ht 69.0 in | Wt 239.0 lb

## 2022-08-13 DIAGNOSIS — R0781 Pleurodynia: Secondary | ICD-10-CM

## 2022-08-13 DIAGNOSIS — R1030 Lower abdominal pain, unspecified: Secondary | ICD-10-CM | POA: Diagnosis not present

## 2022-08-13 LAB — URINALYSIS, ROUTINE W REFLEX MICROSCOPIC
Bilirubin Urine: NEGATIVE
Hgb urine dipstick: NEGATIVE
Ketones, ur: NEGATIVE
Leukocytes,Ua: NEGATIVE
Nitrite: NEGATIVE
RBC / HPF: NONE SEEN (ref 0–?)
Specific Gravity, Urine: 1.025 (ref 1.000–1.030)
Urine Glucose: NEGATIVE
Urobilinogen, UA: 0.2 (ref 0.0–1.0)
pH: 6 (ref 5.0–8.0)

## 2022-08-13 LAB — CBC WITH DIFFERENTIAL/PLATELET
Basophils Absolute: 0 10*3/uL (ref 0.0–0.1)
Basophils Relative: 0.8 % (ref 0.0–3.0)
Eosinophils Absolute: 0.1 10*3/uL (ref 0.0–0.7)
Eosinophils Relative: 2.8 % (ref 0.0–5.0)
HCT: 45.5 % (ref 39.0–52.0)
Hemoglobin: 15.9 g/dL (ref 13.0–17.0)
Lymphocytes Relative: 23.1 % (ref 12.0–46.0)
Lymphs Abs: 1.2 10*3/uL (ref 0.7–4.0)
MCHC: 35 g/dL (ref 30.0–36.0)
MCV: 90 fl (ref 78.0–100.0)
Monocytes Absolute: 0.5 10*3/uL (ref 0.1–1.0)
Monocytes Relative: 10.3 % (ref 3.0–12.0)
Neutro Abs: 3.3 10*3/uL (ref 1.4–7.7)
Neutrophils Relative %: 63 % (ref 43.0–77.0)
Platelets: 182 10*3/uL (ref 150.0–400.0)
RBC: 5.05 Mil/uL (ref 4.22–5.81)
RDW: 12.9 % (ref 11.5–15.5)
WBC: 5.3 10*3/uL (ref 4.0–10.5)

## 2022-08-13 LAB — COMPREHENSIVE METABOLIC PANEL
ALT: 11 U/L (ref 0–53)
AST: 17 U/L (ref 0–37)
Albumin: 4 g/dL (ref 3.5–5.2)
Alkaline Phosphatase: 49 U/L (ref 39–117)
BUN: 11 mg/dL (ref 6–23)
CO2: 27 mEq/L (ref 19–32)
Calcium: 9.2 mg/dL (ref 8.4–10.5)
Chloride: 99 mEq/L (ref 96–112)
Creatinine, Ser: 0.88 mg/dL (ref 0.40–1.50)
GFR: 102.65 mL/min (ref >60.00)
Glucose, Bld: 98 mg/dL (ref 70–99)
Potassium: 4 mEq/L (ref 3.5–5.1)
Sodium: 136 mEq/L (ref 135–145)
Total Bilirubin: 1 mg/dL (ref 0.2–1.2)
Total Protein: 7.2 g/dL (ref 6.0–8.3)

## 2022-08-13 MED ORDER — SILDENAFIL CITRATE 20 MG PO TABS: ORAL_TABLET | ORAL | 3 refills | Status: DC

## 2022-08-13 NOTE — Patient Instructions (Addendum)
    GO TO THE LAB : Get the blood work     GO TO THE FRONT DESK, [facility] Come back for   a physical exam in 2-3  months  STOP BY THE FIRST FLOOR:  get the XR

## 2022-08-13 NOTE — Progress Notes (Unsigned)
   Subjective:    Patient ID: Jerome Barker, male    DOB: Nov 19, 1975, 47 y.o.   MRN: [id number]  DOS:  08/13/2022 Type of visit - description: Acute  For the last 4 weeks has felt a "cord-like vein" and the right anterior chest. It can be TTP sometimes. Denies any injury in the area No fever chills or cough  Also, for the last 2 weeks is having occasional postprandial sharp abdominal pain bilaterally.  Located on the lower abdomen.  No associated with nausea vomiting or diarrhea.  No blood in the stools but the stools are occasionally dark. Appetite is normal.  Denies dysuria, gross hematuria or difficulty urinating but sometimes the flow is slow.   Review of Systems See above   Past Medical History:  Diagnosis Date   Allergy    seasonal   Asthma    Hay fever     Past Surgical History:  Procedure Laterality Date   CYST EXCISION     from head updated 02/12/22   NO PAST SURGERIES      Current Outpatient Medications  Medication Instructions   sildenafil (REVATIO) 20 MG tablet Take 3-4 tablets by mouth qhs prn       Objective:   Physical Exam Chest:       Comments: Area where the patient reports a "cord like vein". On self-examination today he could not find any abnormality.  Palpated the area of concern and is essentially normal.  I felt this was the R chest wall border.   BP 122/86   Pulse (!) 104   Temp 97.8 F (36.6 C) (Oral)   Resp 18   Ht 5\' 9"  (1.753 m)   Wt 239 lb (108.4 kg)   SpO2 97%   BMI 35.29 kg/m  General:   Well developed, NAD, BMI noted.  HEENT:  Normocephalic . Face symmetric, atraumatic Thyromegaly: Mildly palpable thyroid gland on the right Lymphatic system: No LADs at the axillary areas on either side Chest wall: See graphic.  Nipples normal, no breast tissue per se on either side. Lungs:  CTA B Normal respiratory effort, no intercostal retractions, no accessory muscle use. Heart: RRR,  no murmur.  Abdomen:  Not distended, soft,  non-tender. No rebound or rigidity.   Skin: Not pale. Not jaundice Lower extremities: no pretibial edema bilaterally  Neurologic:  alert & oriented X3.  Speech normal, gait appropriate for age and unassisted Psych--  Cognition and judgment appear intact.  Cooperative with normal attention span and concentration.  Behavior appropriate. No anxious or depressed appearing.     Assessment     Assessment Hyperglycemia: A1c 6. 05/29/2018  Asthma Allergies Goiter-- per [place] 06-2013 ,dominant R nodule. BX (-) 06-2013; failed multiple attempts for [place] f/u.  (no-show, pt cancellations)    PLAN: Pain, R chest wall anteriorly: See HPI, he could not find any abnormality today on self exam, plan: Get a chest x-ray otherwise observation Lower abdominal pain: Going on for 2 weeks, no red flags other than possibly dark stools. Plan: CMP CBC, if anemia proceed with further evaluation. Goiter: I again brought up the issue, he is not interested on pursuing a thyroid ultrasound. Patient education: He comes sporadically for acute problems, recommend to RTC for CPX and reassess above symptoms. He verbalized understanding.

## 2022-08-14 ENCOUNTER — Telehealth: Payer: Self-pay | Admitting: Internal Medicine

## 2022-08-14 NOTE — Telephone Encounter (Signed)
Awaiting provider review.

## 2022-08-14 NOTE — Telephone Encounter (Signed)
Pt saw lab results in his mychart but didn't see provider's comments.Advised that provider has not had a chance to see results yet. Please call to advise.

## 2022-08-14 NOTE — Assessment & Plan Note (Signed)
Pain, R chest wall anteriorly: See HPI, he could not find any abnormality today on self exam, plan: Get a chest x-ray otherwise observation Lower abdominal pain: Going on for 2 weeks, no red flags other than possibly dark stools. Plan: CMP CBC, if anemia proceed with further evaluation. Goiter: I again brought up the issue, he is not interested on pursuing a thyroid ultrasound. Patient education: He comes sporadically for acute problems, recommend to RTC for CPX and reassess above symptoms. He verbalized understanding.

## 2022-08-15 NOTE — Telephone Encounter (Signed)
Pt called to go over labs- informed per PCP results are as stated below. He is worried and the protein, sperm and mucus that is in the urine. Informed I'd ask PCP and call back with recommendations.

## 2022-08-15 NOTE — Telephone Encounter (Signed)
The presence of mucus is nonspecific.    Seen sperm in the urine is not abnormal if the patient has been sexually active within the days prior to the sample. Plan is to repeat a UA/microalbumin at the next opportunity

## 2022-08-15 NOTE — Telephone Encounter (Signed)
Caller Name [facility] Phone Number [phone] Patient Name Jerome Barker Patient DOB 02/03/1976 Call Type Message Only Information Provided Reason for Call Request for Lab/Test Results Initial Comment Caller states he wanted to go over his results. Disp. Time Disposition Final User 08/15/2022 6:11:29 AM General Information Provided Yes Saintclair Halsted Call Closed By: Saintclair Halsted Transaction Date/Time: 08/15/2022 6:06:53 AM (ET)

## 2022-08-15 NOTE — Telephone Encounter (Signed)
  [name], Your blood work looks good, no anemia, liver test are normal. The urine showed no infection.  Written by Colon Branch, MD on 08/14/2022  7:48 PM EDT Seen by patient [name] on 08/14/2022  8:49 PM

## 2022-08-15 NOTE — Telephone Encounter (Signed)
Spoke w/ Pt- informed of PCP recommendations. CPX appt scheduled for June 10.

## 2022-10-14 DIAGNOSIS — N3943 Post-void dribbling: Secondary | ICD-10-CM | POA: Insufficient documentation

## 2022-11-04 ENCOUNTER — Encounter: Payer: BC Managed Care – PPO | Admitting: Internal Medicine

## 2024-02-09 ENCOUNTER — Other Ambulatory Visit: Payer: Self-pay | Admitting: Internal Medicine

## 2024-02-11 ENCOUNTER — Other Ambulatory Visit: Payer: Self-pay | Admitting: Internal Medicine

## 2024-02-18 ENCOUNTER — Other Ambulatory Visit: Payer: Self-pay | Admitting: Internal Medicine
